# Patient Record
Sex: Male | Born: 1945 | Race: White | Hispanic: No | Marital: Married | Smoking: Former smoker
Health system: Southern US, Community
[De-identification: ages and names within clinical notes are randomized; demographics above are authoritative.]

## PROBLEM LIST (undated history)

## (undated) DIAGNOSIS — C801 Malignant (primary) neoplasm, unspecified: Secondary | ICD-10-CM

## (undated) DIAGNOSIS — I4892 Unspecified atrial flutter: Secondary | ICD-10-CM

## (undated) DIAGNOSIS — M3131 Wegener's granulomatosis with renal involvement: Secondary | ICD-10-CM

## (undated) DIAGNOSIS — E271 Primary adrenocortical insufficiency: Secondary | ICD-10-CM

## (undated) HISTORY — PX: COLON SURGERY: SHX602

## (undated) NOTE — Telephone Encounter (Signed)
 Formatting of this note might be different from the original. Timothy Chase called and would like a call back.  Is this an urgent matter? No  Information from caller: Timothy Chase states he received a message to schedule a left heart cath - angiogram. He states he already had this and not sure why he's getting a request to schedule another one. PSS unable to see a referral or order for this. Please reach out to him to discuss at your earliest convenience.   Are you ok with a MyChart response from your providers office? No  PATIENT SERVICE Education Administrator signed by Eleanor Skelton at 11/25/2024  9:02 AM PST

## (undated) NOTE — Procedures (Signed)
 Associated Order(s): CARDIAC DEVICE CHECK - REMOTE Procedure(s): CARDIAC DEVICE CHECK - REMOTE Formatting of this note might be different from the original. REMOTE MONITOR TRANSMISSION/FOLLOW-UP  Reason for transmission: Routine monthly device check   The patient's Implantable cardiac device received a remote transmission   Please see results of this interrogation in the Cardiology Procedure Report Tab in [facility] Chart  Interpretation: normal device function  - Battery status: Good  - Rate trends and histograms: Unremarkable   - Symptom-triggered episodes: None  - Auto-triggered episodes: 1 pause is undersensing  Disposition/Plan: Follow-up as scheduled, continue monthly checks  Morene Rush, MD, [facility] Department of Cardiology [address] [place], [place] [address] Provider Priority Line [phone] Patient Line: [phone] Fax [phone]  Electronically signed by Andrea Harden, RN at 11/24/2024  3:09 PM PST

## (undated) NOTE — Telephone Encounter (Signed)
 Formatting of this note might be different from the original. No care due was identified. [employer] Embedded Care Due Messages. Reference number:  [id number]. 11/04/2024 6:00:39 AM PST Electronically signed by Edi, Transcription at 11/04/2024  6:00 AM PST

## (undated) NOTE — Telephone Encounter (Signed)
 Formatting of this note is different from the original. PREDNISONE  REFILL  Is Patient up to date on labs: Yes/No pt had CBC and BMP 10.08.25 PH but no CMP since 06.04.24 Is Patient up to date on office visit: Yes Verified Dose: Yes Verified Refill Quantity: Yes Is Patient due for refill: Yes  Is Patient on a tapering dose: No Is Patient asking for Prednisone  taper: No  NOTE: If patient is on long term prednisone  please verify with provider before denying medication.   Medication refilled per protocol? No-Sending to provider to review  Last DEXA:    Protocol: CMP Annually unless otherwise noted) Lab Results  Component Value Date   NA 142 12/06/2023   K 4.8 12/06/2023   CL 103 12/06/2023   CO2 26 12/06/2023   BUN 23.5 (H) 12/06/2023   CREATININE 1.49 (H) 12/06/2023   AST 25 05/22/2023   ALT 18 05/22/2023   BILITTOT 0.27 05/22/2023   ALP 53 05/22/2023   ALB 3.64 (L) 12/06/2023     Last Office visit: 09/11/2024  ASSESSMENT: 1.  GPA. 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 Lower suspicion for RP. To date he has had no site that is sufficient for a biopsy diagnosis and because of a high clinical suspicion for GPA, he's been on prednisone , other conventional immunomodulatory agents.  With symptoms refractory to conventional therapy, he underwent rituximab induction and maintenance therapy. He had done really well on rituximab maintenance, which was eventually discontinued. After discontinuing he developed multiple symptoms including neurosensory hearing loss, dizziness, malaise, shortness of breath. He was also found to have proteinuria and slightly worsened renal function. He also had some ground glass opacities on chest CT, though it was pulmonary's feeling that this was less suspicious for GPA. He's seen nephrology, no signs of  active GPA. Suspects CKD with minimal proteinuria. CT also showed improvement with resolution of prior ground glass opacities. Again, no clear sign of active pulmonary involvement.   Interval assessment: Continues to do well, clinically stable. Recommend continuing on prednisone  as he's had difficulty tapering in the past and likely has some degree of adrenal insufficiency.  He is aware of symptoms to watch for, he will let us  know.  No indication to start any additional immunomodulatory therapy at this time.  May consider reducing prednisone  in the future but this has been difficult for him in the past.   2.  Prevention of steroid-induced osteoporosis, high risk of osteoporosis. Repeat DEXA was normal. He's been on  Fosamax on and off for approximately 10 years. Will repeat DEXA sometime after 01/2025.   PLAN: -Continue prednisone  5 mg daily for now -Follow-up nephrology  RETURN VISIT: 6 months   Electronically signed by Damien Bane, LPN at 88/79/7974 12:06 PM PST

## (undated) NOTE — Telephone Encounter (Signed)
 Formatting of this note is different from the original.   Reason for call   hypertension  Call Type    Incoming call from nurse triage queue.  Caller   Patient Advised they were on a recorded line, name and DOB of patient verified. ROI not needed caller is patient. Patient gave the phone to family's friend,  Assessment   Initial Assessment Questions Responses  Onset today  Location BP  Character (Aggravating/Alleviating factors, radiating, pattern etc.) Patient's friend reports BP 200/110. States patient is asymptomatic. Patient only reports headache 5/10.  The caller states Patient is walking fine. At the end of call, the friend reported patient is currently in [place] .   Treatments Tried None reported  Denies Chest pain, vision changes, difficulty breathing.   *All other symptoms negative as per protocol and/or guideline referenced in this encounter.  Plan advised by RN    Per protocol advised that patient be seen in office now.  Emergency Department precautions reviewed with the caller.   Patient/Caller response to advised plan   The Caller verbalizes understanding of the advice given and agrees to the plan. Advised patient to walk-in to [facility] for further evaluation.  Reason for Disposition  Systolic BP >= 200 OR Diastolic >= 120 and having NO cardiac or neurologic symptoms  Additional Information  Negative: Sounds like a life-threatening emergency to the triager  Negative: Systolic BP >= 160 OR Diastolic >= 100, and any cardiac (e.g., breathing difficulty, chest pain) or neurologic symptoms (e.g., new-onset blurred or double vision)  Negative: Patient sounds very sick or weak to the triager  Protocols used: Blood Pressure - High-A-OH  Electronically signed by Buel Glasgow, RN at 12/12/2024  2:09 PM PST

## (undated) NOTE — Progress Notes (Signed)
 Formatting of this note is different from the original. In-Person Office Visit [facility] [place], [place], 12 CARDIOLOGY  Patient: Timothy Chase   Timothy Number: [id number]   Date of Birth: 01-Dec-1946   Date of Visit: 10/17/2024    PCP: Marvis Shack, MD Primary Cardiologist: DOROTHA Opal, MD  Subjective   Timothy Chase is a pleasant 64 y.o. year old male with medical hx including:   Wegener's granulomatosis, atrial flutter s/p ablation (2014?), CAD, syncope, HTN,   Arrives today for follow up of recent angiogram due to fatigue, substernal chest discomfort with diaphoresis.   States he continues to have some weakness and fatigue which is unchanged, he has not had any eposdes of chest pain and overall feels okay.  States his blood pressure fluctuates frequently, states it is all over from 120's-180's. Not consistent in general, however he does check it daily.  No report today of chest pain, chest pressure, shortness of breath, palpitations, orthopnea, dyspnea on exertion or syncope. No lower extremity or abdominal edema, unexpected weight gain.    Relevant Tests and Imaging   ECG:  10/17/24 Sinus bradycardia Otherwise normal ECG  ECHO:  10/06/2024: Summary:                      The calculated ejection fraction is 60% using the 3D full volume method. Impaired left ventricular relaxation with normal left atrial filling pressures (normal for age). Normal right ventricular size and function. Right ventricular systolic pressure of 31 mmHg consistent with normal pulmonary artery pressures. Mild-moderate mitral regurgitation is present.   CARDIAC CATHETERIZATION: 09/24/2024: Cardiac Catheterization Lab Report   Procedures:  Selective coronary angiography  Left heart catheterization   Brief History: Timothy Chase is an 34 year old man with a history of syncope,  chest pain, coronary artery disease referred for cardiac catheterization  for unstable angina.   Time Out was performed.    Anesthesia:  I provided Moderate Sedation. During this time I was  face-to-face with the patient and supervising the independent trained  observer (RN); who monitored the patient?s level of consciousness and  physiological status. Total Face-To-Face Intra-Service Time was 30  minutes.   Description: After informed consent was obtained, the patient was brought  to the cath lab in the fasting state.  After dual circulation was  confirmed in the right upper extremity the right arm was prepped and  draped in the usual sterile fashion and 1% lidocaine  without epinephrine   was used as local anesthesia over the right radial artery.  Then, using  direct ultrasound guidance and micropuncture technique, a 6 French slender  sheath was advanced into the right radial artery without difficulty.   Heparin was given, intra-arterial nitroglycerin 200 g  and intra-arterial  verapamil 2.5 mg were also given.   The right coronary artery was engaged using a 6 French diagnostic JR4  catheter.  The left coronary artery was engaged using a 6 French  diagnostic JL 3.5 catheter.  Left heart catheterization was performed  using a 5 French angled pigtail catheter.   At the conclusion all wires and catheters were withdrawn and a TR band was  placed for hemostasis.   The patient tolerated the procedure well and there are no immediate  complications.   Findings:   1. Hemodynamics: The LVEDP is 16 mmHg. No gradient across the aortic  valve.   2. Coronary anatomy:  Left Main: Large smoothly contoured vessel with no significant disease  dividing into LAD and circumflex.  Left anterior descending: Moderate size vessel with smooth contours.   There is a large first diagonal branch which has no significant disease  and traverses the lateral wall.  The continuing LAD has minor  irregularities and takes some tortuosity in the mid vessel before  continuing to the apex with no focal disease.   Circumflex:  Large vessel with minor calcification in the AV groove but  with no focal stenosis.  There are 2 obtuse marginal branches proximally  which are normal and the vessel terminates into a final obtuse marginal  branch which is normal.   Right Coronary Artery: Large dominant vessel with minor irregularities in  the mid vessel.  The distal vessel is smoothly contoured and divides into  a large posterolateral branch and a large posterior descending artery.  Of  note, there is a small RV marginal branch arising from the mid vessel and  it is mildly tortuous but smoothly contoured.  At its terminus there is a  small region of myocardial blush suggestive of a vascularized structure in  that territory.   Technical factors:  Sedation: fentanyl and versed  Contrast: Isovue 28 mL  Radiation: 86 mGy   Complications: None   Conclusions:   1. Mildly elevated left ventricular end-diastolic pressure  2. Mild nonobstructive epicardial coronary artery disease   LABS:  Lab Results  Component Value Date   GLUCOSE 110 12/06/2023   BUN 23.5 (H) 12/06/2023   CREATININE 1.49 (H) 12/06/2023   CALCIUM 8.8 12/06/2023   NA 142 12/06/2023   K 4.8 12/06/2023   CL 103 12/06/2023   CO2 26 12/06/2023   GFRAA >60 07/27/2020   GFRNONAA 59 07/27/2020   Hemoglobin A1C  Date Value Ref Range Status  05/13/2021 5.8 (H) 4.0 - 5.6 % Final    Comment:    Increased risk for diabetes (prediabetes): 5.7-6.4%. Diabetes: >/= 6.5%. Interpretive information based on Diagnosis and Classification of Diabetes Mellitus, American Diabetes Association.  For patients with hemoglobinopathies, hemolytic anemia, other hemolytic diseases, and polycythemia, measurements of HgBA1c may not be a reliable indicator of glucose load.   INR  Date Value Ref Range Status  02/05/2013 2.4 (H) 0.8 - 1.2 Final   Lab Results  Component Value Date   CHOL 178.00 11/22/2023   LDLDIRECT 100 (H) 11/22/2023   HDL 63.90 11/22/2023   TRIG  105 11/22/2023   ASCVD:  The ASCVD Risk score (Arnett DK, et al., 2019) failed to calculate for the following reasons:   The 2019 ASCVD risk score is only valid for ages 25 to 30  Objective  There were no vitals taken for this visit.  Physical Exam Constitutional:      General: Not in acute distress.    Appearance: Normal appearance.  Eyes:     General: No scleral icterus. Neck:     Vascular: Jugular veins flat Cardiovascular/Extremities:   - Regular rate  - Regular rhythm. - No murmur - No rub or gallop, PMI non displaced. - No lower extremity edema.  - No clubbing or cyanosis of extremities. Pulmonary:     Effort: Pulmonary effort is normal.     Breath sounds: No wheezing, rhonchi or rales.  Skin:    General: Skin is warm and dry.  Neurological:     General: No focal deficit present.     Mental Status: Alert and oriented to person, place, and time.  Psychiatric:        Mood and Affect: Mood normal.  Behavior: Behavior normal.   Current Outpatient Medications   Current Outpatient Medications  Medication Sig Dispense Refill   aspirin  (ASPIRIN  EC) 81 MG EC tablet Take 1 tablet by mouth daily. 100 tablet 2   predniSONE  (DELTASONE ) 5 MG tablet Take 1 tablet by mouth daily. 90 tablet 0   No current facility-administered medications for this visit.    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 Cardiac Event Monitor 08/26/21: Benign event monitor. - Generally sinus rhythm at normal rates. Average heart rate is 63 bpm. - No concerning pauses or high-grade heart block.  No significant dysrhythmias.  Sinus bradycardia during sleeping hours.  B. Transthoracic echocardiogram 05/28/21: 1. Left ventricular ejection fraction, by estimation, is 55 to 60%. The left ventricle has normal function. The left ventricle has no regional wall motion abnormalities. There is  mild left ventricular hypertrophy. Left ventricular diastolic parameters   are consistent with Grade I diastolic dysfunction (impaired relaxation).   2. Right ventricular systolic function is normal. The right ventricular size is normal. Tricuspid regurgitation signal is inadequate for assessing PA pressure.   3. The mitral valve is normal in structure. Trivial mitral valve regurgitation. No evidence of mitral stenosis.   4. The aortic valve is tricuspid. Aortic valve regurgitation is not visualized. Mild to moderate aortic valve sclerosis/calcification is present, without any evidence of aortic stenosis.   5. The inferior vena cava is normal in size with greater than 50% respiratory variability, suggesting right atrial pressure of 3 mmHg     Atrial flutter  (HCC) [I48.92] 12/16/2012    Priority: High    Overview Note:    A. [facility] 12/13/12: atrial flutter with rapid ventricular response, spontaneously converted B. echocardiogram 12/13/2012:  normal left ventricular size and systolic function, normal  right ventricular size and systolic function, no significant valvular disease C. 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    Coronary artery disease involving native coronary artery of native heart without angina pectoris [I25.10] 10/17/2024    Overview Note:    09/24/2024: 1. Mildly elevated left ventricular end-diastolic pressure  2.  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 - due to chronic steroid use - continue prednisone    Syncope [R55] 05/28/2021    Overview Note:    Last Assessment & Plan:  Formatting of this note is different from the original. Given association of these episodes happening in the bathroom after having either been in the shower or having a bowel movement, vasovagal is also favored (maybe some contribution of covid as well?).  Orthostatics negative on admission.  Other considered etiology would be symptomatic bradycardia although prior office notes show his  heart rate is at baseline in the 50s and he has not been symptomatic previously -Checking echo to evaluate for other structural abnormalities.  EKG unremarkable for conduction defects.  - echo also reassuring  - Cardiology consulted for opinion as well. CTA chest obtained, negative for PE. - at this time major etiologies have been ruled out.  - this may still be vasovagal given they keep happening when uses the bathroom (not everytime but the episodes all have using bathroom in common); other consideration would be possibly needing some workup more with adrenals since he has been on chronic prednisone . 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  Unable to obtain tissue diagnosis inside of sinus and lung biopsy.left lower lobe nodularity and groundglass appearance identified on CT scan 2012 .  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 06/12/2000 rectal [facility] at [facility] - T3 N0 at 5-7 cm. 06/22/2000 ?colonoscopy at [facility] - tubular adenoma, only pathology seen. 2001 chemoradiotherapy. 01/08/2001 low anterior resection with diverting ileostomy at [facility] 03/20/2001 ileoostomy takedown at [place]. 01/24/2002 rectal biopsy at [facility] - granulation tissue. 2005 colonoscopy at [facility] per later notes. 05/23/2012 colonoscopy Dr. Douglass at [facility] - 8 mm rectal polyp, hyperplastic polyp favored over sessile serrated adenoma.; diverticulosis, anastomosis not mentioned. 07/29/2014 colonoscopy Dr. Jama at [facility] - rectal nodule, hyperplastic polyp and granulation tissue; no other description. 04/28/2020 colonoscopy Dr. Jama - 4 mm tubular adenoma, sigmoid with focal mild active colitis, hemorrhoids, divertculosis. - repeat 5 years optional if in good health   History of colonic polyps [Z86.0100] 05/23/2012    Overview Note:    See under history of rectal cancer   Sterile pyuria [R82.81] 01/30/2012   Osteoporosis/osteopenia increased risk [Z91.89] 12/21/2011   Social History   Tobacco Use  Smoking Status Former   Current packs/day: 0.00   Average packs/day: 1 pack/day for 10.0 years (10.0 ttl pk-yrs)   Types: Cigarettes   Start date: 04/07/1964   Quit date: 04/07/1974   Years since quitting: 50.5  Smokeless Tobacco Never   Social History    Substance and Sexual Activity  Alcohol Use No   Family History  Problem Relation Name Age of Onset   Cancer Mother Nehemias Sauceda    Vision loss Mother Environmental Consultant    Cancer Father Gaspare Netzel    Colon cancer Neg Hx     Colon polyps Neg Hx     Bladder cancer Neg Hx     Bleeding disorders Neg Hx     Urolithiasis Neg Hx     Prostate cancer Neg Hx     Kidney cancer Neg Hx     Skin cancer Neg Hx     Glaucoma Neg  Hx     Macular degen Neg Hx     Assessment & Plan   Problem List Items Addressed This Visit      Cardiac and Vasculature   Coronary artery disease involving native coronary artery of native heart without angina pectoris - Primary   Overview  09/24/2024: 1. Mildly elevated left ventricular end-diastolic pressure  2.  Mild nonobstructive epicardial coronary artery disease     -Skanda is recovering well post angiogram. -He does continue to have fatigue, however has not had any chest pain episodes. -He is taking his medications daily as prescribed. -He is due to go out of town over the next few months and will not be bringing his ILR device. -Follow up with Dr. Milo in ~4 months, sooner if needed with new or worsening concerns.    I spent 26 minutes today on this patient's visit. This includes obtaining and documenting the history and physical, discussion with the patient regarding the treatment plan, review of records for relevant visits, tests, and procedures along with summary of these results in the appropriate section of this note. This time is excluding any time spent on separately billed tests/procedures.  This note was prepared in part using Arts development officer. Please excuse any errors related to the Dragon transcription system.  Vernell Slade, [facility] Cardiology [address] [place], [address]  [place], [place] [address]  Provider Priority Line [phone] Patient Line: [phone] Fax [phone] Electronically signed by Vernell Slade, ARNP at 10/17/2024  1:26 PM PDT

## (undated) NOTE — Telephone Encounter (Signed)
 Formatting of this note might be different from the original. Called patient and informed that scheduling is only available through 01/13/2025 at this time. Recall notification is set for 12/15/2024. Patient will be out of town from 10/29/2024 to end of February, and this recall timing should be appropriate. Patient agrees to keep this as set. Electronically signed by Lauraine Hancock, RN at 10/28/2024 10:51 AM PST

## (undated) NOTE — Progress Notes (Signed)
 Formatting of this note is different from the original. Images from the original note were not included. Patient: Timothy Chase MRN: [id number]  DOB: 16-Feb-1946   SUBJECTIVE:  56 y.o. male here for follow up for GPA, prevention of steroid induced osteoporosis.  Last seen 04/08/2024 where we continued prednisone  5 mg daily, recommended follow up with nephrology.  Overall reports is doing very well.  Denies any nasal congestion, discharge or bloody noses.  Denies any ear or nose pain or swelling.  Denies any sinus pressure, ocular pain or vision changes.  Denies any neurologic symptoms, joint pain or swelling, unusual rashes.  Does report some low energy.  Cardiologist is looking into potential cardiac causes.  Denies any shortness of breath.  Maintains prednisone  5 mg daily and has not forgotten doses.  Tolerating this well.  No recent infections.  No fractures.  Will be having a bone density scan hopefully next year. Pain severity rated 4/10 today   ROS: no visual changes, rashes, shortness of breath, fevers, chills, infections,new or worsening joint swelling, slurred speech, weakness, numbness or current HA's, hemoptysis.  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   Sterile pyuria   History of colonic polyps   Atrial flutter  (HCC)   History of rectal cancer   Bradycardia   SOB (shortness of breath)   Granulomatosis with polyangiitis  (HCC)   Calcific tendinitis of both shoulders   Primary osteoarthritis of both hips   Wegener's granulomatosis   Chronic tension-type headache, intractable   Other headache syndrome   Primary osteoarthritis of both first carpometacarpal joints   Primary osteoarthritis of both hands   Primary osteoarthritis of wrists, bilateral   Stokes-Adams syncope   Adrenal insufficiency   History of stroke   Syncope   Uncontrolled steroid-induced diabetes mellitus   Stage 3a chronic kidney disease  (HCC)   Other proteinuria   Chronic  fatigue   Chronic kidney disease-mineral and bone disorder   Interstitial pulmonary disease, unspecified (HCC)   Anemia of chronic renal failure, stage 3b  (HCC)   Primary osteoarthritis of left knee   Outpatient Medications Marked as Taking for the 09/11/24 encounter (Office Visit) with Cresencio Argyle, MD  Medication Sig Dispense Refill   aspirin  (ASPIRIN  EC) 81 MG EC tablet Take 1 tablet by mouth daily. 100 tablet 2   predniSONE  (DELTASONE ) 5 MG tablet Take 1 tablet by mouth daily. 90 tablet 0   Allergies: No Known Allergies   Social History   Socioeconomic History   Marital status: Married    Spouse name: Not on file   Number of children: Not on file   Years of education: Not on file   Highest education level: Not on file  Occupational History    Employer: RETIRED  Tobacco Use   Smoking status: Former    Current packs/day: 0.00    Average packs/day: 1 pack/day for 10.0 years (10.0 ttl pk-yrs)    Types: Cigarettes    Start date: 04/07/1964    Quit date: 04/07/1974    Years since quitting: 50.4   Smokeless tobacco: Never  Vaping Use   Vaping status: Never Used  Substance and Sexual Activity   Alcohol use: No   Drug use: No   Sexual activity: Not Currently    Partners: Female    Birth control/protection: None    Comment: married  Other Topics Concern   Not on file  Social History Narrative   Not on file  Social Drivers of Corporate Investment Banker Strain: Not on file  Food Insecurity: No Food Insecurity (06/28/2023)   Hunger Vital Sign    Worried About Running Out of Food in the Last Year: Never true    Ran Out of Food in the Last Year: Never true  Transportation Needs: No Transportation Needs (06/28/2023)   PRAPARE - Administrator, Civil Service (Medical): No    Lack of Transportation (Non-Medical): No  Physical Activity: Not on file  Stress: Not on file  Social Connections: Not on file  Intimate Partner Violence: Not At Risk (05/28/2021)   Received  from [facility]   Humiliation, Afraid, Rape, and Kick questionnaire    Within the last year, have you been afraid of your partner or ex-partner?: No    Within the last year, have you been humiliated or emotionally abused in other ways by your partner or ex-partner?: No    Within the last year, have you been kicked, hit, slapped, or otherwise physically hurt by your partner or ex-partner?: No    Within the last year, have you been raped or forced to have any kind of sexual activity by your partner or ex-partner?: No  Housing Stability: Low Risk  (06/28/2023)   Housing Stability Vital Sign    Unable to Pay for Housing in the Last Year: No    Number of Places Lived in the Last Year: 1    Unstable Housing in the Last Year: No   Family History: No rheumatic disease reported.   PHYSICAL EXAM:  BP 124/64 (Site: Left, Position: Sitting, Cuff Size: Standard)   Temp 97.4 F (36.3 C) (Temporal)   Wt 160 lb (72.6 kg)   BMI 24.33 kg/m  General appearance: Normally developed, appropriately groomed Psychiatric: Oriented to time, person.  Normal affect. Eyes: PERRL, Conjunctivae and eyelids are normal Nose: Nasal bridge deformity present.  No tenderness. Ears: No swelling or tenderness. Respiratory: Clear to auscultation bilaterally.  No wheezes, rhonchi or crackles.  Normal respiratory effort Cardiovascular: Regular rate and rhythm, no murmurs, rubs or gallops.   Skin: No  ulcerations or nodules. Musculoskeletal: Bony prominence noted in the PIP joints   LAB DATA: Lab Results  Component Value Date   WBC 6.5 12/31/2023   HGB 12.9 (L) 12/31/2023   HCT 40.7 12/31/2023   MCV 93.8 12/31/2023   PLT 268 12/31/2023   Lab Results  Component Value Date   NA 142 12/06/2023   K 4.8 12/06/2023   CL 103 12/06/2023   CO2 26 12/06/2023   BUN 23.5 (H) 12/06/2023   CREATININE 1.49 (H) 12/06/2023   GLUCOSE 110 12/06/2023   AST 25 05/22/2023   ALT 18 05/22/2023   BILITTOT 0.27 05/22/2023   ALP 53  05/22/2023   ALB 3.64 (L) 12/06/2023   EGFRNR 47 12/06/2023   CALCIUM 8.8 12/06/2023   PROTEIN 6.4 (L) 05/22/2023   GLOB 2.3 05/22/2023   Lab Results  Component Value Date   SEDRATE 11 12/31/2023   Lab Results  Component Value Date   CRP 1.3 (H) 12/31/2023   Component     Latest Ref Rng & Units 03/02/2023 03/02/2023 03/02/2023         1:54 PM  1:54 PM  1:54 PM  Color, Urine     Colorless-Yellow-Dark Yellow-Amber NA  Yellow   Clarity, Urine     Clear NA  Clear   Glucose, UA     Negative NA  Negative  Ketones, Urine     Negative NA  Negative   Urine Specific Gravity     1.002 - 1.030 NA  1.019   Red Blood Cells, Urine     Negative NA 0-4 Negative   pH, UA     5.0 - 8.0 NA  7.5   Protein, Urine     Negative NA  Negative 15  Nitrite, Urine     Negative NA  Negative   Leukocytes Esterase, UA     Negative NA  Negative   White Blood Cells, Urine     0 - 5 /hpf  0-5   Bacteria, Urin     None Seen-Few NA  None Seen   Squamous Epithelial Cells, Urine     0 - 5 /hpf  0-2   Hyaline Casts, Urine     0 - 2 /lpf  0-2   Prot/Creat Ratio, Ur     25 - 148 mg/g creat   90  Creatinine,U     20 - 320 mg/dL   833  Protein/Creat Ratio     0.025 - 0.148 mg/mg creat   0.090   Component     Latest Ref Rng & Units 04/10/2022  MYELOPEROXIDASE ANTIBODY     <1.0 AI <1.0  Proteinase 3 by EIA     <1.0 AI <1.0   02/16/2022  Component     Latest Ref Rng 11/04/2014  Creatine Kinase     39 - 308 U/L 58  Aldolase      4.4   STUDIES: 01/31/2023 EXAM: DXA CLINICAL HISTORY: Age: 21 years Gender: Male Risk Factors: Chronic steroid use COMPARISON: July 15, 2020 TECHNIQUE: The study was performed using the [employer] bone densitometer. RESULTS: Femoral Neck: BMD measured at the Left femoral neck is 0.850 g/cm sq. T score: -0.6 Z score: 0.9 Prior Femoral Neck: BMD measured at the Left femoral neck is 0.825 g/cm sq. T score: -0.8 Total Hip: BMD measured at the Left total  hip is 1.070 g/cm sq. T score: 0.2 Z score: 1.2 Prior Total Hip: BMD measured at the Left total hip is 1.113 g/cm sq. T score: 0.5 Lumbar spine previously nondiagnostic. Wrist: BMD measured at the Left distal 1/3 radius is 0.902 g/cm sq. T score: 1.6 Z score: 3.5 Prior Wrist: BMD measured at the Left distal 1/3 radius is 0.895 g/cm sq. T score: 1.5 Z score: 3.2 Comments: None. Patient has been treated with Fosamax in the last two years. IMPRESSION: Diagnosis: Normal bone mineral density Fracture risk: Not Increased Interval Change: Bone mineral density has not significantly changed overall compared to 2021.  05/04/2022 EXAM: CT CHEST WO CONTRAST  IMPRESSION: 1.  Overall improved appearance of bilateral scattered groundglass opacities compared to 03/20/2022, and with waxing and waning appearance dating back to 2012 likely represent infectious or inflammatory etiology. 2.  No suspicious pulmonary nodules. 3.  Cholelithiasis. Lung nodule recommendations: *N0 No lung nodules  03/20/2022 EXAM: HIGH RESOLUTION CT OF THE CHEST IMPRESSION: Multifocal nodular foci along the lower lobes bilaterally with semisolid nodules bilaterally with peripheral fluffy margins and groundglass adjacent to each, few groundglass opacities elsewhere. Suspect each of these is postinflammatory and new since June 2022. Recommend short-term reassessment follow-up in 4-6 weeks after appropriate therapy to assess for changes over time. If these persist, further imaging such as PET/CT and/or histologic sampling might be needed No significant adenopathy No consolidations or effusions Stable cardiomegaly without congestive features Cholelithiasis  03/06/2022 Transthoracic Echocardiography Report (TTE)  Conclusions   Normal left ventricular size and function.   Ejection fraction is estimated at 60-65%.   Normal right ventricle size and function.   Normal diastolic filling pattern.   No significant  valvular abnormalities.   Right ventricular systolic pressure estimate normal.   02/16/2022 EXAM: XR CHEST 2 VIEWS (XR CHEST PA AND LAT) IMPRESSION: No acute cardiopulmonary process.  05/16/2018 BRAIN MRI WITH AND WITHOUT IV CONTRAST  IMPRESSION: Interval worsening right maxillary sinus disease, otherwise stable appearance of the left occipital DVA, right parietal calvarial hemangioma and 2 small superficial scalp nodules.  05/16/2018 MRI OF THE CERVICAL SPINE WITH AND WITHOUT IV CONTRAST  IMPRESSION: 1. Significant left neuroforaminal narrowing at C3-4 and C4-5, likely impinging upon the exiting C4 and C5 nerve roots. 2. Mild central canal narrowing at C3-4. 3. Chronic appearing degenerative disc disease at the C3-C7 levels as above. 4. No enhancing paraspinal soft tissue mass seen postcontrast.  03/19/2018 DEXA BONE DENSITY  IMPRESSION: 1. Normal study.   03/19/2018 DEXA BONE DENSITY WRIST IMPRESSION: Normal study   03/12/2017 LUMBAR SPINE 3 VIEWS IMPRESSION: Multilevel degenerative disc disease and facet disease, most pronounced at L4-5 and L5-S1.  03/12/2017 BILATERAL HIP, INCLUDING AP PELVIS, 3 VIEW COMPLETE IMPRESSION: Stable x-ray appearance of both hips and pelvis.   03/12/2017 THREE-VIEW CERVICAL SPINE  IMPRESSION: Multilevel degenerative disc disease present, the findings most pronounced at C5-6, C6-7 and C3-4.  03/12/2017 TWO VIEW CHEST X-RAY IMPRESSION: No acute process seen on two view chest X-ray.  02/06/2017 MRI BRAIN W WO CONTRAST  IMPRESSION: 1.  Interval resolution of right maxillary sinus mucosal thickening.  2.  Otherwise stable enhanced MRI of the brain. 3.  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 4.  Stable right parietal calvarial hemangioma. 5.  2 small stable enhancing superficial scalp nodules  07/06/2016 MRI KNEE LEFT  WO CONTRAST  IMPRESSION: 1.  Abnormal exam, with diffuse advanced osteoarthritis of the knee. This most significantly affects the  medial and patellofemoral compartments. The medial meniscus is nearly entirely absent, and this may either be secondary to prior surgical meniscectomy or severe meniscal degradation. There is additionally near complete chondromalacia of the medial compartment. Less severe chondromalacia of the patellofemoral compartment and lateral compartment is noted. There are numerous loose body seen throughout the joint. The lateral meniscus appears degenerated and atrophic with marginal degenerative fraying of the free edge and a complex appearing tear of the anterior horn adjacent to the root. A small para meniscal cyst is evident in this location.  06/14/2016 XR KNEE 3V LEFT (AP/LAT/SUNRISE)  IMPRESSION: 1.  Small, possibly loose, intra-articular ossicle just caudal to the patella.  2.  Tricompartmental DJD, moderate medially. 3.  Moderate suprapatellar effusion.  01/26/2016 MRI BRAIN W WO CONTRAST  IMPRESSION: 1. Stable enhanced MRI of the brain. 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 There is no evidence of hemorrhage. No new enhancing lesions within the brain are identified. There is no MR evidence of acute intracranial ischemic event. 2. 2. Acute over chronic right maxillary sinus inflammation as seen on prior examination. 3. 3. Stable patchy T2 hyperintensity within the left mastoid sinus, consistent with chronic inflammation.  4. 4. Stable benign calvarial lesion on the right, most consistent with a hemangioma.  09/20/2015 DEXA IMPRESSION: Diagnosis: Normal bone mineral density Fracture risk: Not Increased  06/18/2015 MRI BRAIN W WO CONTRAST IMPRESSION: 1.  No acute intracranial abnormality. 2.  Chronic isolated right maxillary sinus mucosal thickening with new small dependent  fluid. There is nearby metallic susceptibility artifact suggesting prior trauma or surgery. 3.  Small patchy fluid in the inferior left mastoid  air cells, similar to prior. 4.  Unchanged nonaggressive appearing right parietal calvarial lesion, likely a hemangioma. 5.  Left parieto-occipital developmental venous anomaly.  06/15/2015 XR SHOULDER 4 VIEW LT (INT ROT/GRASHEY/AXILLARY/SCAP-Y) IMPRESSION: Calcific tendinitis suggested.  06/15/2015 XR SHOULDER 4 VIEW RT (INT ROT/GRASHEY/AXILLARY/SCAP-Y) IMPRESSION: Calcific tendinitis suggested.  06/15/15 XR HIPS BILATERAL AP LATERAL W AP PELVIS IMPRESSION: 1.  Mild bilateral hip osteoarthritis.  11/11/2014 MRI BRAIN W WO CONTRAST IMPRESSION:  1. Stable examination. No new specific orbital or brain parenchymal abnormality evident. 2. Stable enhancing right parietal calvarial lesion, may represent a hemangioma. 3. 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 4. Moderately severe right maxillary sinus disease.  01/09/2013 XR CHEST 2 VIEWS (XR CHEST PA AND LAT)  IMPRESSION: :  1. No acute cardiopulmonary process.  ASSESSMENT: 1.  GPA. 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 Lower suspicion for RP. To date he has had no site that is sufficient for a biopsy diagnosis and because of a high clinical suspicion for GPA, he's been on prednisone , other conventional immunomodulatory agents.  With symptoms refractory to conventional therapy, he underwent rituximab induction and maintenance therapy. He had done really well on rituximab maintenance, which was eventually discontinued. After discontinuing he developed multiple symptoms including neurosensory hearing loss, dizziness, malaise, shortness of breath. He was also found to have proteinuria and slightly worsened renal function. He also had some ground glass opacities on chest CT, though it was pulmonary's feeling that this was less suspicious for GPA. He's seen  nephrology, no signs of active GPA. Suspects CKD with minimal proteinuria. CT also showed improvement with resolution of prior ground glass opacities. Again, no clear sign of active pulmonary involvement.   Interval assessment: Continues to do well, clinically stable. Recommend continuing on prednisone  as he's had difficulty tapering in the past and likely has some degree of adrenal insufficiency.  He is aware of symptoms to watch for, he will let us  know.  No indication to start any additional immunomodulatory therapy at this time.  May consider reducing prednisone  in the future but this has been difficult for him in the past.   2.  Prevention of steroid-induced osteoporosis, high risk of osteoporosis. Repeat DEXA was normal. He's been on  Fosamax on and off for approximately 10 years. Will repeat DEXA sometime after 01/2025.   PLAN: -Continue prednisone  5 mg daily for now -Follow-up nephrology  RETURN VISIT: 6 months   Portions of this note may have been dictated using voice recognition software and may from time to time have grammatical errors which are unintentional. I followed appropriate precautions, and wore proper PPE during this patient encounter including a mask   I, Lum Devonshire, am scribing for, and in the presence of Dr. Cresencio Argyle. I, Dr. Cresencio Argyle, personally performed the services described in this documentation, as scribed by Lum Devonshire in my presence, and it is both accurate and complete.    Electronically signed by Cresencio Argyle, MD at 09/11/2024  3:23 PM PDT

## (undated) NOTE — Telephone Encounter (Signed)
 Formatting of this note might be different from the original. Luz does not need a second angiogram at this time.  Zeus scheduled for f/u with  Dr Milo, 02/11/25 at 1120am at [facility]. Electronically signed by Delon Endo, RN at 11/25/2024  4:50 PM PST

## (undated) NOTE — Telephone Encounter (Signed)
 Formatting of this note might be different from the original. Patient called and would like a call back.  Is this an urgent matter? No  Information from caller:   Patient called to schedule his 4 month follow up which would be for the end of February. Now appts available to schedule. He is going out of town and is asking for someone in Cardiology to send him a scheduling ticket when appointments become available.  Are you ok with a MyChart response from your providers office? Yes  PATIENT SERVICE Education Administrator signed by Powell Sicks at 10/24/2024  1:04 PM PST

## (undated) NOTE — Progress Notes (Signed)
 Formatting of this note is different from the original. [facility]  ASSESSMENT/PLAN:  Timothy Chase is a 51 y.o. year old, male, with Wegener's granulomatosis, atrial flutter s/p ablation (2014?),  Coronary artery disease, syncope.   Recurring chest discomfort concerning for angina: Coronary artery disease by CT: Incidental finding of scattered atherosclerosis on CT of chest. Worsening frequency of substernal discomfort with diaphoresis. Fatigue: normal rhythm by ILR. No significant bradycardia. Cr 1.49. Monitor. - Get coronary angiogram. Discussed risks and benefits. - For profound fatigue, get echo. -We reviewed the pathophysiology of coronary artery disease, risk factor alleviation, and treatment.  I counseled the patient on signs and symptoms of angina and heart failure which would prompt immediate evaluation.   - aspirin  81 mg daily. - LDL is 100mg /Dl. If CAD on angiogram, start statin.  Atrial flutter status post ablation: Sinus bradycardia: Syncope: Suspect vasovagal syncope(frequent syncope or presyncope after using the bathroom, showering).  No new syncopal events.   -At Dr. Nicola recommendation, continue with ILR.  No anticoagulation for now.  -Not on beta-blocker due to bradycardia.  Hypertension: Currently normal blood pressures.   -We reviewed the pathophysiology of hypertension, contributing factors, its effect on cardiac disease, and lifestyle modifiers and medication to alleviate this risk. -   Return to clinic: 3 months  The ASCVD Risk score (Arnett DK, et al., 2019) failed to calculate for the following reasons:   The 2019 ASCVD risk score is only valid for ages 77 to 41  HISTORY OF PRESENT ILLNESS:  Timothy Chase reports intermittent chest discomfort which has been occurring with increased frequency. Two events in the last three months. Severe discomfort / squeezing, now lasting 5 minutes.   He complains also of severe fatigue and  lightheadedness on his feet.  He is without exertional chest pain or pressure.  He complains of mild fatigue but is able to walk for many miles.  No orthopnea, PND, edema.  No fever, chills or night sweats. No history of black tarry stool or GI bleed. All other systems are negative.  There are no Patient Instructions on file for this visit.  Current Outpatient Medications on File Prior to Visit  Medication Sig Dispense Refill   aspirin  (ASPIRIN  EC) 81 MG EC tablet Take 1 tablet by mouth daily. 100 tablet 2   predniSONE  (DELTASONE ) 5 MG tablet Take 1 tablet by mouth daily. 90 tablet 0   No current facility-administered medications on file prior to visit.   Parts of this note were prepared using dictation software. Please excuse inadvertent transcription errors.  PHYSICAL EXAM: GENERAL: pleasant male, alert, oriented x 3, in NAD HEENT: Sclera anicteric, oral mucous membranes moist CARDIOVASCULAR / Extremities - No bruits, JVP without JVD,  - S1, S2. Rate within normal limits.  - No murmur, rub or gallop.  - No edema.  - No clubbing or cyanosis of extremities. - Radial pulses bilaterally palpable.  RESPIRATORY:   Breathing non-labored.  Breath sounds clear equal to auscultation.  GI: Abdomen soft and nontender, liver and spleen nonpalpable MUSCULOSKELETAL: Spine without kyphosis or scoliosis. SKIN: warm and dry, no obvious rashes PSYCH: Normal affect. Alert and oriented.  Wt Readings from Last 3 Encounters:  09/19/24 161 lb (73 kg)  09/11/24 160 lb (72.6 kg)  09/10/24 160 lb 12.8 oz (72.9 kg)   Body mass index is 24.48 kg/m. Blood pressure 138/70, pulse 59, height 5' 8 (1.727 m), weight 161 lb (73 kg).   LABS: Lab Results  Component  Value Date   NA 142 12/06/2023   K 4.8 12/06/2023   CL 103 12/06/2023   CO2 26 12/06/2023   BUN 23.5 (H) 12/06/2023   CREATININE 1.49 (H) 12/06/2023   GLUCOSE 110 12/06/2023   Lab Results  Component Value Date   WBC 6.5 12/31/2023    HGB 12.9 (L) 12/31/2023   HCT 40.7 12/31/2023   MCV 93.8 12/31/2023   PLT 268 12/31/2023   Lab Results  Component Value Date   ALT 18 05/22/2023   AST 25 05/22/2023   Lab Results  Component Value Date   CHOL 178.00 11/22/2023   LDLDIRECT 100 (H) 11/22/2023   HDL 63.90 11/22/2023   TRIG 105 11/22/2023   RECORDS REVIEWED: I personally reviewed the old records and recent laboratory data. Important data summarized in the problem list and Past medical history section including available ECG tracings, echocardiogram reports, stress imaging, and angiogram reports.  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   Unspecified nonsenile cataract    Wegener's granulomatosis    Past Surgical History:  Procedure Laterality Date   ABLATION OF DYSRHYTHMIC FOCUS     COLON SURGERY  2000   see problem list   COLONOSCOPY     see problem list   CYSTOSCOPY  02/21/2012   KNEE SURGERY     PR INSERTION SUBQ CARDIAC RHYTHM MONITOR W/PRGRMG N/A 10/26/2021   INSERTION,SUBCUTANEOUS CARDIAC RHYTHM MONITOR,INCLUDING PROGRAMMING performed by Morene Rush, MD at [facility]   PR XCAPSL CTRC RMVL INSJ IO LENS PROSTH W/O ECP Right 03/08/2016   EXTRACAPSULAR CATARACT REMOVAL BY MECHANICAL TECHNIQUE WITH INSERTION OF INTRAOCULAR LENS PROSTHESIS performed by Artist Slain, MD at [facility]   PR XCAPSL CTRC RMVL INSJ IO LENS PROSTH W/O ECP Left 04/05/2016   EXTRACAPSULAR CATARACT REMOVAL BY MECHANICAL TECHNIQUE WITH INSERTION OF INTRAOCULAR LENS PROSTHESIS performed by Artist Slain, MD at [facility]   sinus biopsy      TONSILLECTOMY AND ADENOIDECTOMY     UPPER GASTROINTESTINAL ENDOSCOPY  04/28/2020   Dr. 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 Cardiac Event Monitor 08/26/21: Benign event monitor. - Generally sinus rhythm at normal rates. Average heart rate is 63 bpm. - No concerning pauses or high-grade heart block.  No significant dysrhythmias.  Sinus bradycardia during sleeping hours.  B. Transthoracic echocardiogram 05/28/21: 1. Left ventricular ejection fraction, by estimation, is 55 to 60%. The left ventricle has normal function. The left ventricle has no regional wall motion abnormalities. There is mild left ventricular hypertrophy. Left ventricular diastolic parameters   are consistent with Grade I diastolic dysfunction (impaired relaxation).   2. Right ventricular systolic function is normal. The right ventricular size is normal. Tricuspid regurgitation signal is inadequate for assessing PA pressure.   3. The mitral valve is normal in structure. Trivial  mitral valve regurgitation. No evidence of mitral stenosis.   4. The aortic valve is tricuspid. Aortic valve regurgitation is not visualized. Mild to moderate aortic valve sclerosis/calcification is present, without any evidence of aortic stenosis.   5. The inferior vena cava is normal in size with greater than 50% respiratory variability, suggesting right atrial pressure of 3 mmHg     Atrial flutter  (HCC) [I48.92] 12/16/2012    Priority: High    Overview Note:    A. [facility] 12/13/12: atrial flutter with rapid ventricular response, spontaneously converted B. echocardiogram 12/13/2012:  normal left ventricular size and systolic function, normal  right ventricular size and systolic function, no significant valvular disease C. electrophysiology study and catheter ablation 02/14/13:  Cavotricuspid Isthmus ablation with bidirectional block achieved.    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 - due to chronic steroid use - continue prednisone    Syncope [R55] 05/28/2021    Overview Note:    Last Assessment & Plan:  Formatting of this note is different from the original. Given association of these episodes happening in the bathroom after having either been in the shower or having a bowel movement, vasovagal is also favored (maybe some contribution of covid as well?).  Orthostatics negative on admission.  Other considered etiology would be symptomatic bradycardia although prior office notes show his heart rate is at baseline in the 50s and he has not been symptomatic previously -Checking echo to evaluate for other structural abnormalities.  EKG unremarkable for conduction defects.  - echo also reassuring  - Cardiology consulted for opinion as well. CTA chest obtained, negative for PE. - at this time major etiologies have been ruled out.  - this may still be vasovagal given they keep happening when uses the bathroom (not everytime but the episodes all have using bathroom in common); other consideration would be possibly needing some workup more with adrenals since he has been on chronic prednisone . 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  Unable to obtain tissue diagnosis inside of sinus and lung biopsy.left lower lobe nodularity and groundglass appearance identified on CT scan 2012 .  On maintenance Rituxan   Calcific tendinitis of both shoulders [M75.31, M75.32] 07/12/2015   Primary osteoarthritis of both hips [M16.0] 07/12/2015   Bradycardia [R00.1] 11/20/2014   SOB (shortness of breath) [R06.02] 11/20/2014   History of rectal cancer [Z85.048] 07/03/2014    Overview  Note:    05/09/2000 FLS at [facility] - 1 cm flat rectal adenocarcinoma. 06/12/2000 rectal ultrasound at [facility] - T3 N0 at 5-7 cm. 06/22/2000 ?colonoscopy at [facility] - tubular adenoma, only pathology seen. 2001 chemoradiotherapy. 01/08/2001 low anterior resection with diverting ileostomy at [facility]. 03/20/2001 ileoostomy takedown at [place]. 01/24/2002 rectal biopsy at [facility] - granulation tissue. 2005 colonoscopy at [facility] per later notes. 05/23/2012 colonoscopy Dr. Douglass at [facility] - 8 mm rectal polyp, hyperplastic polyp favored over sessile serrated adenoma.; diverticulosis, anastomosis not mentioned. 07/29/2014 colonoscopy Dr. Jama at [facility] - rectal nodule, hyperplastic polyp and granulation tissue; no other description. 04/28/2020 colonoscopy Dr. Jama - 4 mm tubular adenoma, sigmoid with focal mild active colitis, hemorrhoids, divertculosis. - repeat 5 years optional if in good health   History of colonic polyps [Z86.0100]  05/23/2012    Overview Note:    See under history of rectal cancer   Sterile pyuria [R82.81] 01/30/2012   Osteoporosis/osteopenia increased risk [Z91.89] 12/21/2011   No Known Allergies  Outpatient Medications Marked as Taking for the 09/19/24 encounter (Office Visit) with Dorn Opal, MD  Medication Sig Dispense Refill   aspirin  (ASPIRIN  EC) 81 MG EC tablet Take 1 tablet by mouth daily. 100 tablet 2   predniSONE  (DELTASONE ) 5 MG tablet Take 1 tablet by mouth daily. 90 tablet 0   Social History   Socioeconomic History   Marital status: Married    Spouse name: Not on file   Number of children: Not on file   Years of education: Not on file   Highest education level: Not on file  Occupational History    Employer: RETIRED  Tobacco Use   Smoking status: Former    Current packs/day: 0.00    Average packs/day: 1 pack/day for 10.0 years (10.0 ttl pk-yrs)    Types: Cigarettes    Start date: 04/07/1964    Quit date: 04/07/1974    Years since quitting: 50.4   Smokeless tobacco: Never  Vaping Use   Vaping status: Never Used  Substance and Sexual Activity   Alcohol use: No   Drug use: No   Sexual activity: Not Currently    Partners: Female    Birth control/protection: None    Comment: married  Other Topics Concern   Not on file  Social History Narrative   Not on file   Social Drivers of Health   Financial Resource Strain: Not on file  Food Insecurity: No Food Insecurity (06/28/2023)   Hunger Vital Sign    Worried About Running Out of Food in the Last Year: Never true    Ran Out of Food in the Last Year: Never true  Transportation Needs: No Transportation Needs (06/28/2023)   PRAPARE - Administrator, Civil Service (Medical): No    Lack of Transportation (Non-Medical): No  Physical Activity: Not on file  Stress: Not on file  Social Connections: Not on file  Intimate Partner Violence: Not At Risk (05/28/2021)   Received from [facility]   Humiliation, Afraid,  Rape, and Kick questionnaire    Within the last year, have you been afraid of your partner or ex-partner?: No    Within the last year, have you been humiliated or emotionally abused in other ways by your partner or ex-partner?: No    Within the last year, have you been kicked, hit, slapped, or otherwise physically hurt by your partner or ex-partner?: No    Within the last year, have you been raped or forced to have  any kind of sexual activity by your partner or ex-partner?: No  Housing Stability: Low Risk  (06/28/2023)   Housing Stability Vital Sign    Unable to Pay for Housing in the Last Year: No    Number of Places Lived in the Last Year: 1    Unstable Housing in the Last Year: No   family history includes Cancer in his father and mother; Vision loss in his mother. Thank you once again for allowing me to participate in the care of [facility]. Sincerely,  Dorn Opal, MD [facility] Department of Cardiology [address] [place], [place] [address] phone [phone] Fax [phone]  The After Visit Summary for this visit was printed and reviewed with the patient. All related questions were answered. The patient expressed understanding of the document.  CC: Lynwood Moulding, MD  Electronically signed by Dorn Opal, MD at 09/19/2024 10:09 AM PDT

## (undated) NOTE — Telephone Encounter (Signed)
 Formatting of this note is different from the original. Medication(s) Being Requested:   Requested Prescriptions   Pending Prescriptions Disp Refills   predniSONE  (DELTASONE ) 5 MG tablet 90 tablet 0    Sig: Take 1 tablet by mouth daily.   Chart Review:   Last Office Visit/Telemedicine with Rheumatologist: 09/11/2024 Next appointment with Rheumatologist: Visit date not found  Associated Vitals/Lab Work:   Lab Results  Component Value Date   WBC 6.5 12/31/2023   HGB 12.9 (L) 12/31/2023   HCT 40.7 12/31/2023   MCV 93.8 12/31/2023   PLT 268 12/31/2023   Lab Results  Component Value Date   NA 142 12/06/2023   K 4.8 12/06/2023   CL 103 12/06/2023   CO2 26 12/06/2023   BUN 23.5 (H) 12/06/2023   CREATININE 1.49 (H) 12/06/2023   GLUCOSE 110 12/06/2023   AST 25 05/22/2023   ALT 18 05/22/2023   BILITTOT 0.27 05/22/2023   ALP 53 05/22/2023   ALB 3.64 (L) 12/06/2023   EGFRNR 47 12/06/2023   CALCIUM 8.8 12/06/2023   PROTEIN 6.4 (L) 05/22/2023   GLOB 2.3 05/22/2023   BP Readings from Last 3 Encounters:  10/17/24 (!) 152/78  09/26/24 (!) 140/76  09/19/24 138/70     Action:   Pending to Department MA: This is an off protocol medication request. Prescription pended for further review.   Electronically signed by Rosina Bedelia Candy, RN at 11/04/2024  8:47 AM PST

---

## 2021-05-28 ENCOUNTER — Inpatient Hospital Stay (HOSPITAL_BASED_OUTPATIENT_CLINIC_OR_DEPARTMENT_OTHER)
Admission: EM | Admit: 2021-05-28 | Discharge: 2021-05-30 | DRG: 178 | Disposition: A | Discharge status: Home / Self Care | Payer: Medicare Other | Attending: Internal Medicine | Admitting: Internal Medicine

## 2021-05-28 ENCOUNTER — Emergency Department (HOSPITAL_BASED_OUTPATIENT_CLINIC_OR_DEPARTMENT_OTHER): Payer: Medicare Other

## 2021-05-28 ENCOUNTER — Encounter (HOSPITAL_BASED_OUTPATIENT_CLINIC_OR_DEPARTMENT_OTHER): Payer: Self-pay

## 2021-05-28 ENCOUNTER — Other Ambulatory Visit: Payer: Self-pay

## 2021-05-28 DIAGNOSIS — J449 Chronic obstructive pulmonary disease, unspecified: Secondary | ICD-10-CM | POA: Diagnosis not present

## 2021-05-28 DIAGNOSIS — E2740 Unspecified adrenocortical insufficiency: Secondary | ICD-10-CM | POA: Diagnosis present

## 2021-05-28 DIAGNOSIS — E273 Drug-induced adrenocortical insufficiency: Secondary | ICD-10-CM | POA: Diagnosis not present

## 2021-05-28 DIAGNOSIS — N179 Acute kidney failure, unspecified: Secondary | ICD-10-CM | POA: Diagnosis present

## 2021-05-28 DIAGNOSIS — Z8679 Personal history of other diseases of the circulatory system: Secondary | ICD-10-CM

## 2021-05-28 DIAGNOSIS — Z85038 Personal history of other malignant neoplasm of large intestine: Secondary | ICD-10-CM | POA: Diagnosis not present

## 2021-05-28 DIAGNOSIS — Z7952 Long term (current) use of systemic steroids: Secondary | ICD-10-CM | POA: Diagnosis not present

## 2021-05-28 DIAGNOSIS — Z87891 Personal history of nicotine dependence: Secondary | ICD-10-CM

## 2021-05-28 DIAGNOSIS — M3131 Wegener's granulomatosis with renal involvement: Secondary | ICD-10-CM | POA: Diagnosis present

## 2021-05-28 DIAGNOSIS — R55 Syncope and collapse: Secondary | ICD-10-CM | POA: Diagnosis present

## 2021-05-28 DIAGNOSIS — U071 COVID-19: Secondary | ICD-10-CM | POA: Diagnosis not present

## 2021-05-28 DIAGNOSIS — R4020 Unspecified coma: Secondary | ICD-10-CM

## 2021-05-28 DIAGNOSIS — Z7983 Long term (current) use of bisphosphonates: Secondary | ICD-10-CM | POA: Diagnosis not present

## 2021-05-28 DIAGNOSIS — R001 Bradycardia, unspecified: Secondary | ICD-10-CM | POA: Diagnosis not present

## 2021-05-28 DIAGNOSIS — Z79899 Other long term (current) drug therapy: Secondary | ICD-10-CM

## 2021-05-28 DIAGNOSIS — R030 Elevated blood-pressure reading, without diagnosis of hypertension: Secondary | ICD-10-CM | POA: Diagnosis not present

## 2021-05-28 DIAGNOSIS — T380X5A Adverse effect of glucocorticoids and synthetic analogues, initial encounter: Secondary | ICD-10-CM | POA: Diagnosis not present

## 2021-05-28 HISTORY — DX: Primary adrenocortical insufficiency: E27.1

## 2021-05-28 HISTORY — DX: Unspecified atrial flutter: I48.92

## 2021-05-28 HISTORY — DX: Malignant (primary) neoplasm, unspecified: C80.1

## 2021-05-28 HISTORY — DX: Wegener's granulomatosis with renal involvement: M31.31

## 2021-05-28 LAB — CBC WITH DIFFERENTIAL/PLATELET
Abs Immature Granulocytes: 0.03 10*3/uL (ref 0.00–0.07)
Basophils Absolute: 0 10*3/uL (ref 0.0–0.1)
Basophils Relative: 0 %
Eosinophils Absolute: 0 10*3/uL (ref 0.0–0.5)
Eosinophils Relative: 0 %
HCT: 42.5 % (ref 39.0–52.0)
Hemoglobin: 13.8 g/dL (ref 13.0–17.0)
Immature Granulocytes: 1 %
Lymphocytes Relative: 8 %
Lymphs Abs: 0.5 10*3/uL — ABNORMAL LOW (ref 0.7–4.0)
MCH: 30.4 pg (ref 26.0–34.0)
MCHC: 32.5 g/dL (ref 30.0–36.0)
MCV: 93.6 fL (ref 80.0–100.0)
Monocytes Absolute: 0.7 10*3/uL (ref 0.1–1.0)
Monocytes Relative: 13 %
Neutro Abs: 4.6 10*3/uL (ref 1.7–7.7)
Neutrophils Relative %: 78 %
Platelets: 219 10*3/uL (ref 150–400)
RBC: 4.54 MIL/uL (ref 4.22–5.81)
RDW: 14 % (ref 11.5–15.5)
WBC: 5.9 10*3/uL (ref 4.0–10.5)
nRBC: 0 % (ref 0.0–0.2)

## 2021-05-28 LAB — BASIC METABOLIC PANEL
Anion gap: 9 (ref 5–15)
BUN: 26 mg/dL — ABNORMAL HIGH (ref 8–23)
CO2: 28 mmol/L (ref 22–32)
Calcium: 8.3 mg/dL — ABNORMAL LOW (ref 8.9–10.3)
Chloride: 101 mmol/L (ref 98–111)
Creatinine, Ser: 1.56 mg/dL — ABNORMAL HIGH (ref 0.61–1.24)
GFR, Estimated: 46 mL/min — ABNORMAL LOW (ref >60)
Glucose, Bld: 115 mg/dL — ABNORMAL HIGH (ref 70–99)
Potassium: 4.6 mmol/L (ref 3.5–5.1)
Sodium: 138 mmol/L (ref 135–145)

## 2021-05-28 LAB — CBG MONITORING, ED: Glucose-Capillary: 85 mg/dL (ref 70–99)

## 2021-05-28 LAB — D-DIMER, QUANTITATIVE: D-Dimer, Quant: 0.58 ug/mL-FEU — ABNORMAL HIGH (ref 0.00–0.50)

## 2021-05-28 LAB — RESP PANEL BY RT-PCR (FLU A&B, COVID) ARPGX2
Influenza A by PCR: NEGATIVE
Influenza B by PCR: NEGATIVE
SARS Coronavirus 2 by RT PCR: POSITIVE — AB

## 2021-05-28 LAB — SEDIMENTATION RATE: Sed Rate: 18 mm/hr — ABNORMAL HIGH (ref 0–16)

## 2021-05-28 LAB — TROPONIN I (HIGH SENSITIVITY)
Troponin I (High Sensitivity): 6 ng/L (ref <18)
Troponin I (High Sensitivity): 6 ng/L (ref <18)
Troponin I (High Sensitivity): 5 ng/L (ref <18)

## 2021-05-28 LAB — BRAIN NATRIURETIC PEPTIDE: B Natriuretic Peptide: 74.9 pg/mL (ref 0.0–100.0)

## 2021-05-28 MED ORDER — SODIUM CHLORIDE 0.9 % IV BOLUS
1000.0000 mL | Freq: Once | INTRAVENOUS | Status: AC
  Administered 2021-05-28: 1000 mL via INTRAVENOUS

## 2021-05-28 MED ORDER — NIRMATRELVIR/RITONAVIR (PAXLOVID)TABLET
2.0000 | ORAL_TABLET | Freq: Two times a day (BID) | ORAL | Status: DC
  Administered 2021-05-28 – 2021-05-30 (×4): 2 via ORAL
  Filled 2021-05-28: qty 30

## 2021-05-28 NOTE — ED Notes (Signed)
Pt lying flat for orthostatic vs

## 2021-05-28 NOTE — ED Notes (Signed)
ED Provider at bedside. 

## 2021-05-28 NOTE — Plan of Care (Signed)
  Problem: Education: Goal: Knowledge of General Education information will improve Description: Including pain rating scale, medication(s)/side effects and non-pharmacologic comfort measures Outcome: Progressing   Problem: Health Behavior/Discharge Planning: Goal: Ability to manage health-related needs will improve Outcome: Progressing   Problem: Clinical Measurements: Goal: Ability to maintain clinical measurements within normal limits will improve Outcome: Progressing Goal: Will remain free from infection Outcome: Progressing Goal: Diagnostic test results will improve Outcome: Progressing Goal: Respiratory complications will improve Outcome: Progressing Goal: Cardiovascular complication will be avoided Outcome: Progressing   Problem: Coping: Goal: Level of anxiety will decrease Outcome: Progressing   Problem: Pain Managment: Goal: General experience of comfort will improve Outcome: Progressing   Problem: Safety: Goal: Ability to remain free from injury will improve Outcome: Progressing   Problem: Education: Goal: Knowledge of risk factors and measures for prevention of condition will improve Outcome: Progressing   Problem: Coping: Goal: Psychosocial and spiritual needs will be supported Outcome: Progressing   Problem: Respiratory: Goal: Will maintain a patent airway Outcome: Progressing Goal: Complications related to the disease process, condition or treatment will be avoided or minimized Outcome: Progressing

## 2021-05-28 NOTE — Progress Notes (Signed)
Patient: Timothy Chase, Timothy Chase  DOB: 09-18-2046 MRN: [id number]   I discussed the following case with Janeece Fitting (PA at [facility] ED), who requests transfer of the above patient from [facility] ED for further evaluation and management of multiple episodes of syncope over the last.  Of note, he is reportedly from the state of [place], and is currently in [place] visiting family.   This is a 75 y.o. M with h/o Addison's disease, copd, who presented to [place] on 05/28/2021 complaining of syncope.  He reportedly has experienced at least 2 episodes of syncope over the last day, in which the patient notes duration of unconscious time of approximately 30 minutes per each of these episodes.  Unclear if associated with prodrome, but denies any associated chest pain.  He also reportedly has been experiencing a few days of generalized myalgias, prompting him to take a home COVID test earlier today, which was reportedly positive, although no confirmatory picture of this home test result provided.  He notes a chronic cough, without recent worsening relative to this baseline.  Otherwise, denies any acute respiratory symptoms.   Vital signs at [facility] ED were notable for the following: Afebrile, heart rates in the mid 50s to mid 96E; systolic blood pressures in the 140s to 150smmHg, oxygen saturations in the high 90s on room air.  Orthostatic vital signs reportedly not consistent with orthostatic hypotension.   Labs were notable for BUN 26, creatinine 1.56 with unclear baseline renal function, glucose 115.  CBC notable for wbc 5900, Hgb 13.8.  Troponin I x2 nonelevated at 6 on both occasions.  D-dimer nonelevated after age-related adjustment (0.58).   EKG showed sinus bradycardia with heart rate 54 without evidence of heart block, and no evidence of T wave or ST changes.  CXR showed no acute process.  Aside from a 1 L normal saline bolus, no additional IV fluids or medications administered while at [facility] ED.    Without confirmatory picture of positive home COVID-19 test, I asked EDP to order formal COVID-19 screen.     Subsequently, I accepted this patient for transfer for observation to med/tele bed at [facility] for further work-up and management of multiple syncopal episodes in the last day.     Babs Bertin, DO Hospitalist

## 2021-05-28 NOTE — ED Triage Notes (Signed)
States had a BM and passed out 2 days ago. Today had another syncopal episode. Tested positive for COVID with home test today. C/o headache.

## 2021-05-28 NOTE — ED Provider Notes (Signed)
[facility] Provider Note   CSN: [id number] Arrival date & time: 05/28/21  1249     History Chief Complaint  Patient presents with   Loss of Consciousness    Covid +    Timothy Chase is a 75 y.o. male.  75 y.o male with a PMH of Addisons disease, Osteochondritis presents to the ED with a chief complaint of multiple syncopal episodes since 2 days ago.  Patient reports he was done having a bowel movement, when he suddenly stood up from his bathroom try to ambulate and had a syncopal episode.  He reports he was likely down for about 30 minutes although he does not recall the episode.  In addition, he is continue to feel groggy after this fall.  He thought that after this episode, he thought this was cold sweats might have been food poisoning.  In addition, reports this morning he took a shower, walked over to the sink and stood there began to have cold sweats and had a second syncopal episode, again he was unsure how long he was down for.  He does report similar episodes in the past few years, with the same presentation, happening when he sitting to standing however reports that this time the difference is there is achiness all throughout his body.  He did take a COVID-19 test today which was positive.  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  Also reports a headache, feeling constant aching to the front of his head.  He is currently on daily prednisone 5 mg.  He denies any cardiac history, no family history of blood clots, no family history of heart disease.  Patient is currently here visiting on vacation from [place] state.  Does report prior cardiology, pulmonology, primary care follow-up in his home state.    The history is provided by the patient.  Loss of Consciousness Episode history:  Multiple Most recent episode:  Yesterday Duration:  30  minutes Timing:  Constant Progression:  Unchanged Chronicity:  New Context: bowel movement   Witnessed: no   Relieved by:  Nothing Worsened by:  Nothing Ineffective treatments:  None tried Associated symptoms: diaphoresis, headaches and malaise/fatigue   Associated symptoms: no chest pain, no fever, no palpitations, no shortness of breath and no vomiting   Risk factors: vascular disease   Risk factors: no congenital heart disease and no coronary artery disease       Past Medical History:  Diagnosis Date   Adrenal insufficiency (Addison's disease) ([place])    Cancer ([place])    Colon   Wegner's osteochondritis     Patient Active Problem List   Diagnosis Date Noted   Syncope 05/28/2021     Past Surgical History:  Procedure Laterality Date   COLON SURGERY         History reviewed. No pertinent family history.  Social History   Tobacco Use   Smoking status: Former    Pack years: 0.00    Types: Cigarettes  Substance Use Topics   Alcohol use: Never   Drug use: Never    Home Medications Prior to Admission medications   Medication Sig Start Date End Date Taking? Authorizing Provider  alendronate (FOSAMAX) 70 MG tablet Take 1 tablet by mouth every 7 (seven) days. 12/26/12  Yes [provider]  azaTHIOprine (IMURAN) 50 MG tablet Take by mouth. 05/05/13  Yes [provider]  budesonide-formoterol (SYMBICORT) 80-4.5 MCG/ACT inhaler Inhale into the lungs. 05/06/13  Yes [provider]  predniSONE (DELTASONE) 5 MG tablet TAKE ONE TABLET BY MOUTH ONE TIME DAILY. TAKE WITH 1 MG TABLET FOR A TOTAL DOSE OF 6 MG 04/11/16  Yes [provider]  alendronate (FOSAMAX) 70 MG tablet Take 70 mg by mouth once a week. 04/18/21   [provider]  aspirin 81 MG chewable tablet Chew by mouth.    [provider]  riTUXimab (RITUXAN) 500 MG/50ML injection Inject into the vein.    [provider]    Allergies    Patient has no known  allergies.  Review of Systems   Review of Systems  Constitutional:  Positive for diaphoresis and malaise/fatigue. Negative for chills and fever.  Respiratory:  Positive for chest tightness. Negative for shortness of breath.   Cardiovascular:  Positive for syncope. Negative for chest pain and palpitations.  Gastrointestinal:  Negative for anal bleeding and vomiting.  Musculoskeletal:  Negative for back pain.  Skin:  Negative for pallor.  Neurological:  Positive for syncope and headaches.  All other systems reviewed and are negative.  Physical Exam Updated Vital Signs BP (!) 176/78 (BP Location: Right Arm)   Pulse (!) 54   Temp 98.4 F (36.9 C) (Oral)   Resp 16   Ht 5\' 8"  (1.727 m)   Wt 72.6 kg   SpO2 100%   BMI 24.33 kg/m   Physical Exam Vitals and nursing note reviewed.  Constitutional:      Appearance: Normal appearance.  HENT:     Head: Normocephalic and atraumatic.     Mouth/Throat:     Mouth: Mucous membranes are moist.  Cardiovascular:     Rate and Rhythm: Bradycardia present.  Pulmonary:     Effort: Pulmonary effort is normal.     Breath sounds: No wheezing or rales.     Comments: Lungs are clear to auscultation with no wheezing, rhonchi, rales. Abdominal:     General: Abdomen is flat.  Skin:    General: Skin is warm and dry.  Neurological:     Mental Status: He is alert and oriented to person, place, and time.     Comments: Alert, oriented, thought content appropriate. Speech fluent without evidence of aphasia. Able to follow 2 step commands without difficulty.  Cranial Nerves:  II:  Peripheral visual fields grossly normal, pupils, round, reactive to light III,IV, VI: ptosis not present, extra-ocular motions intact bilaterally  V,VII: smile symmetric, facial light touch sensation equal VIII: hearing grossly normal bilaterally  IX,X: midline uvula rise  XI: bilateral shoulder shrug equal and strong XII: midline tongue extension  Motor:  5/5 in upper and  lower extremities bilaterally including strong and equal grip strength and dorsiflexion/plantar flexion Sensory: light touch normal in all extremities.  Cerebellar: normal finger-to-nose with bilateral upper extremities, pronator drift negative      ED Results / Procedures / Treatments   Labs (all labs ordered are listed, but only abnormal results are displayed) Labs Reviewed  BASIC METABOLIC PANEL - Abnormal; Notable for the following components:      Result Value   Glucose, Bld 115 (*)    BUN 26 (*)    Creatinine, Ser 1.56 (*)    Calcium 8.3 (*)    GFR, Estimated 46 (*)    All other components within normal limits  CBC WITH DIFFERENTIAL/PLATELET - Abnormal; Notable for the following components:   Lymphs Abs 0.5 (*)    All  other components within normal limits  SEDIMENTATION RATE - Abnormal; Notable for the following components:   Sed Rate 18 (*)    All other components within normal limits  D-DIMER, QUANTITATIVE - Abnormal; Notable for the following components:   D-Dimer, Quant 0.58 (*)    All other components within normal limits  RESP PANEL BY RT-PCR (FLU A&B, COVID) [id number]  BRAIN NATRIURETIC PEPTIDE  CBG MONITORING, ED  TROPONIN I (HIGH SENSITIVITY)  TROPONIN I (HIGH SENSITIVITY)  TROPONIN I (HIGH SENSITIVITY)    EKG EKG Interpretation  Date/Time:  Saturday May 28 2021 13:12:40 EDT Ventricular Rate:  54 PR Interval:  142 QRS Duration: 107 QT Interval:  421 QTC Calculation: 399 R Axis:   -14 Text Interpretation: Sinus rhythm Abnormal R-wave progression, early transition Minimal ST depression, lateral leads no STEMI Confirmed by Lavenia Atlas [phone]) on 05/28/2021 2:04:58 PM  Radiology DG Chest Port 1 View  Result Date: 05/28/2021 CLINICAL DATA:  Syncope EXAM: PORTABLE CHEST 1 VIEW COMPARISON:  None. FINDINGS: The cardiomediastinal silhouette is the upper limits of normal in contour. No pleural effusion. No pneumothorax. No acute pleuroparenchymal abnormality.  Visualized abdomen is unremarkable. No acute osseous abnormality noted. IMPRESSION: No acute cardiopulmonary abnormality. Electronically Signed   By: Valentino Saxon MD   On: 05/28/2021 13:52    Procedures Procedures   Medications Ordered in ED Medications  sodium chloride 0.9 % bolus 1,000 mL (0 mLs Intravenous Stopped 05/28/21 1836)    ED Course  I have reviewed the triage vital signs and the nursing notes.  Pertinent labs & imaging results that were available during my care of the patient were reviewed by me and considered in my medical decision making (see chart for details).  Clinical Course as of 05/28/21 1849  Sat May 28, 2021  1405 Creatinine(!): 1.56 No prior records on file [JS]  1702 Sed Rate(!): 18 [JS]  1751 Sed Rate(!): 18 [JS]    Clinical Course User Index [JS] Janeece Fitting, PA-C   MDM Rules/Calculators/A&P     Patient here with a past medical history of Wegener's presents to the ED after multiple syncopal episodes for the past 2 days.  Reports similar history while in his home state of [place] state, he is currently here on vacation.  Has had a total of 3 episodes in the past 2 days.  Feeling all body aches, unsure whether this is due to COVID-19 diagnoses.  Orthostatic vital signs were obtained, not much difference from lying to sitting to standing.  Still provided with 1 L fluids.  He is COVID-positive from today's visit, has a new AKI with a creatinine of 1.5, denies any prior history of kidney disease.  Interpretation of the rest of his labs reveal a CBC without any leukocytosis, hemoglobin is within normal limits.  CBG on arrival was normal.  Sed rate is 18, BNP is negative, no prior history of heart failure.  During reassessment, patient began to complain of chest tightness, a troponin was added to his work-up, first 1 was negative.  Will obtain delta.  Patient has persistently been bradycardic while in the ED, heart rate has been below the 60 range, with  his last one around 53.  No hypoxia, he is normotensive.  Xray of the chest: No acute cardiopulmonary abnormality.  6:40 PM Spoke to Dr. Glo Herring hospitalist at [facility] who will admit patient for further management.  Although patient did have a positive COVID test while at home, will obtain 1 for our records.  Patient remains hemodynamically stable.  Portions of this note were generated with Lobbyist. Dictation errors may occur despite best attempts at proofreading.  Final Clinical Impression(s) / ED Diagnoses Final diagnoses:  Syncope and collapse    Rx / DC Orders ED Discharge Orders     None        Janeece Fitting, PA-C 05/28/21 Foley, [place], DO 05/30/21 [id number]

## 2021-05-29 ENCOUNTER — Other Ambulatory Visit (HOSPITAL_COMMUNITY): Payer: Medicare Other

## 2021-05-29 DIAGNOSIS — J449 Chronic obstructive pulmonary disease, unspecified: Secondary | ICD-10-CM | POA: Diagnosis present

## 2021-05-29 DIAGNOSIS — Z79899 Other long term (current) drug therapy: Secondary | ICD-10-CM | POA: Diagnosis not present

## 2021-05-29 DIAGNOSIS — R55 Syncope and collapse: Secondary | ICD-10-CM | POA: Diagnosis present

## 2021-05-29 DIAGNOSIS — Z7983 Long term (current) use of bisphosphonates: Secondary | ICD-10-CM | POA: Diagnosis not present

## 2021-05-29 DIAGNOSIS — M3131 Wegener's granulomatosis with renal involvement: Secondary | ICD-10-CM | POA: Diagnosis present

## 2021-05-29 DIAGNOSIS — T380X5A Adverse effect of glucocorticoids and synthetic analogues, initial encounter: Secondary | ICD-10-CM | POA: Diagnosis present

## 2021-05-29 DIAGNOSIS — R030 Elevated blood-pressure reading, without diagnosis of hypertension: Secondary | ICD-10-CM | POA: Diagnosis present

## 2021-05-29 DIAGNOSIS — E273 Drug-induced adrenocortical insufficiency: Secondary | ICD-10-CM | POA: Diagnosis present

## 2021-05-29 DIAGNOSIS — R001 Bradycardia, unspecified: Secondary | ICD-10-CM | POA: Diagnosis present

## 2021-05-29 DIAGNOSIS — E271 Primary adrenocortical insufficiency: Secondary | ICD-10-CM | POA: Diagnosis not present

## 2021-05-29 DIAGNOSIS — Z7952 Long term (current) use of systemic steroids: Secondary | ICD-10-CM | POA: Diagnosis not present

## 2021-05-29 DIAGNOSIS — Z8679 Personal history of other diseases of the circulatory system: Secondary | ICD-10-CM

## 2021-05-29 DIAGNOSIS — E2740 Unspecified adrenocortical insufficiency: Secondary | ICD-10-CM

## 2021-05-29 DIAGNOSIS — U071 COVID-19: Secondary | ICD-10-CM | POA: Diagnosis present

## 2021-05-29 DIAGNOSIS — Z85038 Personal history of other malignant neoplasm of large intestine: Secondary | ICD-10-CM | POA: Diagnosis not present

## 2021-05-29 DIAGNOSIS — Z87891 Personal history of nicotine dependence: Secondary | ICD-10-CM | POA: Diagnosis not present

## 2021-05-29 DIAGNOSIS — N179 Acute kidney failure, unspecified: Secondary | ICD-10-CM | POA: Diagnosis present

## 2021-05-29 LAB — URINALYSIS, ROUTINE W REFLEX MICROSCOPIC
Bilirubin Urine: NEGATIVE
Glucose, UA: NEGATIVE mg/dL
Hgb urine dipstick: NEGATIVE
Ketones, ur: NEGATIVE mg/dL
Leukocytes,Ua: NEGATIVE
Nitrite: NEGATIVE
Protein, ur: NEGATIVE mg/dL
Specific Gravity, Urine: 1.015 (ref 1.005–1.030)
pH: 5 (ref 5.0–8.0)

## 2021-05-29 LAB — COMPREHENSIVE METABOLIC PANEL
ALT: 15 U/L (ref 0–44)
AST: 22 U/L (ref 15–41)
Albumin: 3 g/dL — ABNORMAL LOW (ref 3.5–5.0)
Alkaline Phosphatase: 42 U/L (ref 38–126)
Anion gap: 8 (ref 5–15)
BUN: 24 mg/dL — ABNORMAL HIGH (ref 8–23)
CO2: 26 mmol/L (ref 22–32)
Calcium: 8.2 mg/dL — ABNORMAL LOW (ref 8.9–10.3)
Chloride: 105 mmol/L (ref 98–111)
Creatinine, Ser: 1.15 mg/dL (ref 0.61–1.24)
GFR, Estimated: >60 mL/min (ref >60)
Glucose, Bld: 80 mg/dL (ref 70–99)
Potassium: 4 mmol/L (ref 3.5–5.1)
Sodium: 139 mmol/L (ref 135–145)
Total Bilirubin: 0.4 mg/dL (ref 0.3–1.2)
Total Protein: 5.6 g/dL — ABNORMAL LOW (ref 6.5–8.1)

## 2021-05-29 LAB — C-REACTIVE PROTEIN: CRP: 2 mg/dL — ABNORMAL HIGH (ref <1.0)

## 2021-05-29 LAB — GLUCOSE, CAPILLARY: Glucose-Capillary: 85 mg/dL (ref 70–99)

## 2021-05-29 LAB — MAGNESIUM: Magnesium: 2.3 mg/dL (ref 1.7–2.4)

## 2021-05-29 LAB — TSH: TSH: 3.231 u[IU]/mL (ref 0.350–4.500)

## 2021-05-29 LAB — FERRITIN: Ferritin: 177 ng/mL (ref 24–336)

## 2021-05-29 MED ORDER — ONDANSETRON HCL 4 MG PO TABS: 4.0000 mg | ORAL_TABLET | Freq: Four times a day (QID) | ORAL | Status: DC | PRN

## 2021-05-29 MED ORDER — ACETAMINOPHEN 325 MG PO TABS: 650.0000 mg | ORAL_TABLET | Freq: Four times a day (QID) | ORAL | Status: DC | PRN

## 2021-05-29 MED ORDER — ACETAMINOPHEN 650 MG RE SUPP
650.0000 mg | Freq: Four times a day (QID) | RECTAL | Status: DC | PRN
Start: 2021-05-29 — End: 2021-05-31

## 2021-05-29 MED ORDER — MOMETASONE FURO-FORMOTEROL FUM 100-5 MCG/ACT IN AERO
2.0000 | INHALATION_SPRAY | Freq: Two times a day (BID) | RESPIRATORY_TRACT | Status: DC
  Administered 2021-05-29 – 2021-05-30 (×3): 2 via RESPIRATORY_TRACT
  Filled 2021-05-29: qty 8.8

## 2021-05-29 MED ORDER — ALBUTEROL SULFATE HFA 108 (90 BASE) MCG/ACT IN AERS: 2.0000 | INHALATION_SPRAY | Freq: Four times a day (QID) | RESPIRATORY_TRACT | Status: DC | PRN

## 2021-05-29 MED ORDER — ALBUTEROL SULFATE HFA 108 (90 BASE) MCG/ACT IN AERS
2.0000 | INHALATION_SPRAY | Freq: Four times a day (QID) | RESPIRATORY_TRACT | Status: DC
  Administered 2021-05-29 (×2): 2 via RESPIRATORY_TRACT
  Filled 2021-05-29: qty 6.7

## 2021-05-29 MED ORDER — POLYETHYLENE GLYCOL 3350 17 G PO PACK: 17.0000 g | PACK | Freq: Every day | ORAL | Status: DC | PRN

## 2021-05-29 MED ORDER — SODIUM CHLORIDE 0.9 % IV SOLN: INTRAVENOUS | Status: DC

## 2021-05-29 MED ORDER — ONDANSETRON HCL 4 MG/2ML IJ SOLN: 4.0000 mg | Freq: Four times a day (QID) | INTRAMUSCULAR | Status: DC | PRN

## 2021-05-29 MED ORDER — ASPIRIN 81 MG PO CHEW
81.0000 mg | CHEWABLE_TABLET | Freq: Every day | ORAL | Status: DC
  Administered 2021-05-29 – 2021-05-30 (×2): 81 mg via ORAL
  Filled 2021-05-29 (×2): qty 1

## 2021-05-29 MED ORDER — PREDNISONE 5 MG PO TABS
5.0000 mg | ORAL_TABLET | Freq: Every day | ORAL | Status: DC
  Administered 2021-05-29 – 2021-05-30 (×2): 5 mg via ORAL
  Filled 2021-05-29 (×2): qty 1

## 2021-05-29 MED ORDER — HYDROCOD POLST-CPM POLST ER 10-8 MG/5ML PO SUER: 5.0000 mL | Freq: Two times a day (BID) | ORAL | Status: DC | PRN

## 2021-05-29 MED ORDER — TRAMADOL HCL 50 MG PO TABS: 50.0000 mg | ORAL_TABLET | Freq: Four times a day (QID) | ORAL | Status: DC | PRN

## 2021-05-29 MED ORDER — ENOXAPARIN SODIUM 40 MG/0.4ML IJ SOSY
40.0000 mg | PREFILLED_SYRINGE | INTRAMUSCULAR | Status: DC
  Administered 2021-05-29 – 2021-05-30 (×2): 40 mg via SUBCUTANEOUS
  Filled 2021-05-29 (×2): qty 0.4

## 2021-05-29 MED ORDER — GUAIFENESIN-DM 100-10 MG/5ML PO SYRP: 10.0000 mL | ORAL_SOLUTION | ORAL | Status: DC | PRN

## 2021-05-29 NOTE — Hospital Course (Addendum)
Mr. Dubie is a 75 yo male with PMH Wegener's (on chronic prednisone 5 mg daily), adrenal insufficiency 2/2 chronic prednisone use, aflutter s/p ablation, COPD who presented to the hospital after 2 syncopal episodes at home.  He states both episodes were when he was in the bathroom and had finished using the shower and having a bowel movement.  He also endorsed that he had an episode a couple years ago also associated with being in the bathroom.  He was brought to the hospital and underwent further work-up.  He was found to be bradycardic with normal rhythm.  Review of prior office notes from care everywhere show he is chronically bradycardic in the mid 37s. Orthostatic blood pressure was also negative on admission. He denied any significant symptoms associated with COVID, aside from a mild headache.  He was found to be COVID-positive on testing on admission.  Due to his adrenal insufficiency, Wegener's, he was started on Paxlovid.   He was evaluated by cardiology.  Etiology still possibly related to vasovagal response.  Given his recent long travel and COVID diagnosis, he underwent CTA chest to rule out PE which was also negative.

## 2021-05-29 NOTE — Assessment & Plan Note (Addendum)
Given association of these episodes happening in the bathroom after having either been in the shower or having a bowel movement, vasovagal is also favored (maybe some contribution of covid as well?).  Orthostatics negative on admission.  Other considered etiology would be symptomatic bradycardia although prior office notes show his heart rate is at baseline in the 50s and he has not been symptomatic previously -Checking echo to evaluate for other structural abnormalities.  EKG unremarkable for conduction defects.  - echo also reassuring  - Cardiology consulted for opinion as well. CTA chest obtained, negative for PE. - at this time major etiologies have been ruled out.  - this may still be vasovagal given they keep happening when uses the bathroom (not everytime but the episodes all have using bathroom in common); other consideration would be possibly needing some workup more with adrenals since he has been on chronic prednisone. He plans to follow up back home

## 2021-05-29 NOTE — Assessment & Plan Note (Signed)
-   s/p ablation - now sinus brady

## 2021-05-29 NOTE — Assessment & Plan Note (Addendum)
-   due to chronic steroid use - continue prednisone

## 2021-05-29 NOTE — Assessment & Plan Note (Addendum)
-   followed outpatient back home - continue prednisone  -Per care everywhere note: Ocular, joint, and pulmonary involvement.  Followed by rheumatology, pulmonary, ophthalmology

## 2021-05-29 NOTE — Progress Notes (Signed)
Progress Note    Timothy Chase   WUX:[id number]  DOB: 11-27-1946  DOA: 05/28/2021     0  PCP: System, Provider Not In  CC: passed out  Hospital Course: [name] is a 75 yo male with PMH Wegener's (on chronic prednisone 5 mg daily), adrenal insufficiency 2/2 chronic prednisone use, aflutter s/p ablation, COPD who presented to the hospital after 2 syncopal episodes at home.  He states both episodes were when he was in the bathroom and had finished using the shower and having a bowel movement.  He also endorsed that he had an episode a couple years ago also associated with being in the bathroom.  He was brought to the hospital and underwent further work-up.  He was found to be bradycardic with normal rhythm.  Review of prior office notes from care everywhere show he is chronically bradycardic in the mid 11s. Orthostatic blood pressure was also negative on admission. He denied any significant symptoms associated with COVID, aside from a mild headache.  He was found to be COVID-positive on testing on admission.  Due to his adrenal insufficiency, Wegener's, he was started on Paxlovid.    Interval History:  Resting in bed when seen this morning.  Feeling back to normal state other than a mild headache.  He confirmed HPI noting that his episodes have occurred in the bathroom.  He endorsed a history of colon cancer and does have some urgency when going to the restroom to have a bowel movement due to some decreased control.  Vagal response is leading theory at this time given his story/presentation.   ROS: Constitutional: negative for chills and fevers, Respiratory: negative for cough, Cardiovascular: negative for chest pain, and Gastrointestinal: negative for abdominal pain  Assessment & Plan: * Syncope Given association of these episodes happening in the bathroom after having either been in the shower or having a bowel movement, vasovagal is also favored (maybe some contribution of covid as  well?).  Orthostatics negative on admission.  Other considered etiology would be symptomatic bradycardia although prior office notes show his heart rate is at baseline in the 50s and he has not been symptomatic previously -Checking echo to evaluate for other structural abnormalities.  EKG unremarkable for conduction defects - Cardiology consulted for opinion on bradycardia if felt to be cause of syncope - follow up echo, continue tele  COVID-19 virus infection - Mostly asymptomatic, no respiratory symptoms.  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  Followed by rheumatology, pulmonary, ophthalmology    Old records reviewed in assessment of this patient  Antimicrobials: Paxlovid 6/11 >> current  DVT prophylaxis: enoxaparin (LOVENOX) injection 40 mg Start: 05/29/21 1000 SCDs Start: 05/29/21 0038   Code Status:   Code Status: Full Code Family Communication:   Disposition Plan: Status is: Observation  The patient will require care spanning > 2 midnights and should be moved to inpatient because: Ongoing diagnostic testing needed not appropriate for outpatient work up and Inpatient level of care appropriate due to severity of illness  Dispo: The patient is from: Home  Anticipated d/c is to: Home              Patient currently is not medically stable to d/c.   Difficult to place patient No  Risk of unplanned readmission score:     Objective: Blood pressure (!) 156/63, pulse (!) 55, temperature 98.8 F (37.1 C),  temperature source Oral, resp. rate 14, height 5\' 8"  (1.727 m), weight 74.7 kg, SpO2 97 %.  Examination: General appearance: alert, cooperative, and no distress Head: Normocephalic, without obvious abnormality, atraumatic Eyes:  EOMI Lungs: clear to auscultation bilaterally Heart: S1, S2 normal and bradycardic, regular rhythm Abdomen: normal findings: bowel sounds normal and soft, non-tender Extremities:  no edema Skin: mobility and turgor normal Neurologic: Grossly normal  Consultants:  Cardiology  Procedures:    Data Reviewed: I have personally reviewed following labs and imaging studies Results for orders placed or performed during the hospital encounter of 05/28/21 (from the past 24 hour(s))  Troponin I (High Sensitivity)     Status: None   Collection Time: 05/28/21  5:19 PM  Result Value Ref Range   Troponin I (High Sensitivity) 6 <18 ng/L  Resp Panel by RT-PCR (Flu A&B, Covid) Nasopharyngeal Swab     Status: Abnormal   Collection Time: 05/28/21  6:44 PM   Specimen: Nasopharyngeal Swab; Nasopharyngeal(NP) swabs in vial transport medium  Result Value Ref Range   SARS Coronavirus 2 by RT PCR POSITIVE (A) NEGATIVE   Influenza A by PCR NEGATIVE NEGATIVE   Influenza B by PCR NEGATIVE NEGATIVE  Troponin I (High Sensitivity)     Status: None   Collection Time: 05/28/21  9:54 PM  Result Value Ref Range   Troponin I (High Sensitivity) 5 <18 ng/L  Comprehensive metabolic panel     Status: Abnormal   Collection Time: 05/29/21  4:10 AM  Result Value Ref Range   Sodium 139 135 - 145 mmol/L   Potassium 4.0 3.5 - 5.1 mmol/L   Chloride 105 98 - 111 mmol/L   CO2 26 22 - 32 mmol/L   Glucose, Bld 80 70 - 99 mg/dL   BUN 24 (H) 8 - 23 mg/dL   Creatinine, Ser 1.15 0.61 - 1.24 mg/dL   Calcium 8.2 (L) 8.9 - 10.3 mg/dL   Total Protein 5.6 (L) 6.5 - 8.1 g/dL   Albumin 3.0 (L) 3.5 - 5.0 g/dL   AST 22 15 - 41 U/L   ALT 15 0 - 44 U/L   Alkaline Phosphatase 42 38 - 126 U/L   Total Bilirubin  0.4 0.3 - 1.2 mg/dL   GFR, Estimated >60 >60 mL/min   Anion gap 8 5 - 15  Magnesium     Status: None   Collection Time: 05/29/21  4:10 AM  Result Value Ref Range   Magnesium 2.3 1.7 - 2.4 mg/dL  TSH     Status: None   Collection Time: 05/29/21  4:10 AM  Result Value Ref Range   TSH 3.231 0.350 - 4.500 uIU/mL  C-reactive protein     Status: Abnormal   Collection Time: 05/29/21  4:10 AM  Result Value Ref Range   CRP 2.0 (H) <1.0 mg/dL  Ferritin     Status: None   Collection Time: 05/29/21  4:10 AM  Result Value Ref Range   Ferritin 177 24 - 336 ng/mL  Urinalysis, Routine w reflex microscopic Urine, Clean Catch     Status: None   Collection Time: 05/29/21  4:21 AM  Result Value Ref Range  Color, Urine YELLOW YELLOW   APPearance CLEAR CLEAR   Specific Gravity, Urine 1.015 1.005 - 1.030   pH 5.0 5.0 - 8.0   Glucose, UA NEGATIVE NEGATIVE mg/dL   Hgb urine dipstick NEGATIVE NEGATIVE   Bilirubin Urine NEGATIVE NEGATIVE   Ketones, ur NEGATIVE NEGATIVE mg/dL   Protein, ur NEGATIVE NEGATIVE mg/dL   Nitrite NEGATIVE NEGATIVE   Leukocytes,Ua NEGATIVE NEGATIVE  Glucose, capillary     Status: None   Collection Time: 05/29/21  7:38 AM  Result Value Ref Range   Glucose-Capillary 85 70 - 99 mg/dL    Recent Results (from the past 240 hour(s))  Resp Panel by RT-PCR (Flu A&B, Covid) Nasopharyngeal Swab     Status: Abnormal   Collection Time: 05/28/21  6:44 PM   Specimen: Nasopharyngeal Swab; Nasopharyngeal(NP) swabs in vial transport medium  Result Value Ref Range Status   SARS Coronavirus 2 by RT PCR POSITIVE (A) NEGATIVE Final    Comment: RESULT CALLED TO, READ BACK BY AND VERIFIED WITH:  POWELL,V RN @1948  05/28/21 EDENSCA (NOTE) SARS-CoV-2 target nucleic acids are DETECTED.  The SARS-CoV-2 RNA is generally detectable in upper respiratory specimens during the acute phase of infection. Positive results are indicative of the presence of the identified virus, but do not rule out  bacterial infection or co-infection with other pathogens not detected by the test. Clinical correlation with patient history and other diagnostic information is necessary to determine patient infection status. The expected result is Negative.  Fact Sheet for Patients: EntrepreneurPulse.com.au  Fact Sheet for Healthcare Providers: IncredibleEmployment.be  This test is not yet approved or cleared by the [place] FDA and  has been authorized for detection and/or diagnosis of SARS-CoV-2 by FDA under an Emergency Use Authorization (EUA).  This EUA will remain in effect (meaning this test can b e used) for the duration of  the COVID-19 declaration under Section 564(b)(1) of the Act, 21 U.S.C. section 360bbb-3(b)(1), unless the authorization is terminated or revoked sooner.     Influenza A by PCR NEGATIVE NEGATIVE Final   Influenza B by PCR NEGATIVE NEGATIVE Final    Comment: (NOTE) The Xpert Xpress SARS-CoV-2/FLU/RSV plus assay is intended as an aid in the diagnosis of influenza from Nasopharyngeal swab specimens and should not be used as a sole basis for treatment. Nasal washings and aspirates are unacceptable for Xpert Xpress SARS-CoV-2/FLU/RSV testing.  Fact Sheet for Patients: EntrepreneurPulse.com.au  Fact Sheet for Healthcare Providers: IncredibleEmployment.be  This test is not yet approved or cleared by the [place] FDA and has been authorized for detection and/or diagnosis of SARS-CoV-2 by FDA under an Emergency Use Authorization (EUA). This EUA will remain in effect (meaning this test can be used) for the duration of the COVID-19 declaration under Section 564(b)(1) of the Act, 21 U.S.C. section 360bbb-3(b)(1), unless the authorization is terminated or revoked.  Performed at [facility], [address], [place], [place] [address]      Radiology Studies: DG Chest [place] 1  View  Result Date: 05/28/2021 CLINICAL DATA:  Syncope EXAM: PORTABLE CHEST 1 VIEW COMPARISON:  None. FINDINGS: The cardiomediastinal silhouette is the upper limits of normal in contour. No pleural effusion. No pneumothorax. No acute pleuroparenchymal abnormality. Visualized abdomen is unremarkable. No acute osseous abnormality noted. IMPRESSION: No acute cardiopulmonary abnormality. Electronically Signed   By: Valentino Saxon MD   On: 05/28/2021 13:52   DG Chest Port 1 View  Final Result      Scheduled Meds:  aspirin  81 mg Oral Daily   enoxaparin (LOVENOX) injection  40 mg Subcutaneous Q24H   mometasone-formoterol  2 puff Inhalation BID   nirmatrelvir/ritonavir EUA  2 tablet Oral BID   predniSONE  5 mg Oral Q breakfast   PRN Meds: acetaminophen **OR** acetaminophen, albuterol, chlorpheniramine-HYDROcodone, guaiFENesin-dextromethorphan, ondansetron **OR** ondansetron (ZOFRAN) IV, polyethylene glycol, traMADol Continuous Infusions:  sodium chloride 75 mL/hr at 05/29/21 0137     LOS: 0 days  Time spent: Greater than 50% of the 35 minute visit was spent in counseling/coordination of care for the patient as laid out in the A&P.   Dwyane Dee, MD [facility] 05/29/2021, 1:37 PM

## 2021-05-29 NOTE — H&P (Signed)
TRH H&P    Patient Demographics:    Timothy Chase, is a 75 y.o. male  MRN: [id number]  DOB - 26-Aug-1946  Admit Date - 05/28/2021  Referring MD/NP/PA: Med Cnt [facility] Pt  Outpatient Primary MD for the patient is System, Provider Not In  Patient coming from: Home  Chief complaint- syncope   HPI:    Timothy Chase  is a 75 y.o. male, with history of Wegener's disease, adrenal insufficiency, COPD, presents the ER with a chief complaint of syncope.  Patient reports he has had 2 episodes of syncope in the last 24 hours.  The first episode was when he got out of bed early in the morning to use the bathroom, had a large bowel movement, and then felt diaphoretic, flushed, and lightheaded.  He laid down on the floor and blacked out.  He is not sure how much time passed.  He still felt shaky when he woke up, he felt very weak.  Clean himself up and went back to bed.  When he woke up for the day he noted a headache that was relieved with aspirin.  He felt like his normal headache.  He was otherwise back to his normal state of health.  This morning he took a shower, and then when he was bending over the sink brushing his teeth he felt diaphoretic, flushed, and lightheaded.  He reports that he again ended up on the floor and is not sure how much time went by.  Patient denies hitting his head.  He denies any preceding chest pain, palpitations, dyspnea.  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  He has not had any trouble with it since then.  Today he continued to feel generalized weakness and fatigue, so he presented to the ER.  Patient does have a history of Wegener's disease for which he takes prednisone.  He reports that it is affected his hearing and he has bony aches from it.  He reports that he is currently considered to be in remission.  He has adrenal insufficiency from chronic steroid  use.  Patient smoked 30 years ago, he does not use alcohol, he does not use illicit drugs, he is not vaccinated for COVID.  Patient is full code.  In the ED  Patient is afebrile with heart rates in the 30S to 92Z, systolic blood pressures in the 140s to 150s, oxygen saturation in the 90s on room air.  Orthostatic vital signs were not indicative of orthostatic hypotension.  Labs revealed a BUN of 26, creatinine of 1.56 with an unclear baseline.  Patient is from [place] state and chart review is limited.  He had a glucose of 115.  CBC showed no leukocytosis with a white blood cell count of 5.9, hemoglobin 13.8.  Troponins were flat x2.  D-dimer was 0.58-normal for age adjustment.  Patient was given 1 L normal saline bolus and admission was requested for further work-up of syncope.  Patient's admission COVID test was incidentally positive.  Review of systems:    In addition to the HPI above,  No Fever-chills, Admits to chronic headache no changes with Vision or hearing, No problems swallowing food or Liquids, No Chest pain, Cough or Shortness of Breath, No Abdominal pain, No Nausea or Vomiting, bowel movements are regular, No Blood in stool or Urine, No dysuria, No new skin rashes or bruises, No new joints pains-aches,  No new weakness, tingling, numbness in any extremity, No recent weight gain or loss, No polyuria, polydypsia or polyphagia, No significant Mental Stressors.  All other systems reviewed and are negative.    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 No pertinent family history. Family history hypertension   Home Medications:   Prior to Admission medications   Medication Sig Start Date End Date Taking? Authorizing Provider  alendronate (FOSAMAX) 70 MG tablet Take 70 mg by mouth once a week. 04/18/21  Yes [provider]  cholecalciferol (VITAMIN D3) 25 MCG (1000 UNIT) tablet Take 1,000 Units by mouth daily.   Yes [provider]  Multiple Vitamins-Minerals (ZINC PO) Take 1 tablet by mouth daily.   Yes [provider]  predniSONE (DELTASONE) 5 MG tablet Take 5 mg by mouth in the morning. 04/11/16  Yes [provider]     Allergies:    No Known Allergies   Physical Exam:   Vitals  Blood pressure (!) 146/58, pulse 67, temperature 98.4 F (36.9 C), temperature source Oral, resp. rate 20, height 5\' 8"  (1.727 m), weight 74.7 kg, SpO2 99 %. 1.  General: Patient lying supine in bed,  no acute distress   2. Psychiatric: Alert and oriented x 3, mood and behavior normal for situation, pleasant and cooperative with exam   3. Neurologic: Speech and language are normal, face is symmetric, moves all 4 extremities voluntarily, at baseline without acute deficits on limited exam   4. HEENMT:  Head is atraumatic, normocephalic, pupils reactive to light, neck is supple, trachea is midline, mucous membranes are moist   5. Respiratory : Lungs are clear to auscultation bilaterally without wheezing, rhonchi, rales, no cyanosis, no increase in work of breathing or accessory muscle use   6. Cardiovascular : Heart rate bradycardic, rhythm is regular, slight systolic murmur, no rubs or gallops, no peripheral edema, peripheral pulses palpated   7. Gastrointestinal:  Abdomen is soft, nondistended, nontender to palpation bowel sounds active, no masses or organomegaly palpated   8. Skin:  Skin is warm, dry and intact without rashes, acute lesions, or ulcers on limited  exam   9.Musculoskeletal:  No acute deformities or trauma, no asymmetry in tone, no peripheral edema, peripheral pulses palpated, no tenderness to palpation in the extremities     Data Review:    CBC Recent Labs  Lab 05/28/21 1333  WBC 5.9  HGB 13.8  HCT 42.5  PLT 219  MCV 93.6  MCH 30.4  MCHC 32.5  RDW 14.0  LYMPHSABS 0.5*  MONOABS 0.7  EOSABS 0.0  BASOSABS 0.0   ------------------------------------------------------------------------------------------------------------------  Results for orders placed or performed during the hospital encounter of 05/28/21 (from the past 48 hour(s))  CBG monitoring, ED     Status: None   Collection Time: 05/28/21  1:26 PM  Result Value Ref Range   Glucose-Capillary 85 70 - 99 mg/dL    Comment: Glucose reference range applies only to samples taken after fasting for at least 8 hours.   Comment 1 Notify RN   Basic metabolic panel     Status: Abnormal   Collection Time: 05/28/21  1:33 PM  Result Value Ref Range   Sodium 138 135 - 145 mmol/L   Potassium 4.6 3.5 - 5.1 mmol/L   Chloride 101 98 - 111 mmol/L   CO2 28 22 - 32 mmol/L   Glucose, Bld 115 (H) 70 - 99 mg/dL    Comment: Glucose reference range applies only to samples taken after fasting for at least 8 hours.   BUN 26 (H) 8 - 23 mg/dL   Creatinine, Ser 1.56 (H) 0.61 - 1.24 mg/dL   Calcium 8.3 (L) 8.9 - 10.3 mg/dL   GFR, Estimated 46 (L) >60 mL/min    Comment: (NOTE) Calculated using the CKD-EPI Creatinine Equation (2021)    Anion gap 9 5 - 15    Comment: Performed at [facility], [address] [place], [place] [address]  CBC WITH DIFFERENTIAL     Status: Abnormal   Collection Time: 05/28/21  1:33 PM  Result Value Ref Range   WBC 5.9 4.0 - 10.5 K/uL   RBC 4.54 4.22 - 5.81 MIL/uL   Hemoglobin 13.8 13.0 - 17.0 g/dL   HCT 42.5 39.0 - 52.0 %   MCV 93.6 80.0 - 100.0 fL   MCH 30.4 26.0 - 34.0 pg   MCHC 32.5 30.0 - 36.0 g/dL   RDW 14.0 11.5 - 15.5 %   Platelets  219 150 - 400 K/uL   nRBC 0.0 0.0 - 0.2 %   Neutrophils Relative % 78 %   Neutro Abs 4.6 1.7 - 7.7 K/uL   Lymphocytes Relative 8 %   Lymphs Abs 0.5 (L) 0.7 - 4.0 K/uL   Monocytes Relative 13 %   Monocytes Absolute 0.7 0.1 - 1.0 K/uL   Eosinophils Relative 0 %   Eosinophils Absolute 0.0 0.0 - 0.5 K/uL   Basophils Relative 0 %   Basophils Absolute 0.0 0.0 - 0.1 K/uL   Immature Granulocytes 1 %   Abs Immature Granulocytes 0.03 0.00 - 0.07 K/uL    Comment: Performed at [facility], [address] [place], [place] [address]  Sedimentation rate     Status: Abnormal   Collection Time: 05/28/21  1:33 PM  Result Value Ref Range   Sed Rate 18 (H) 0 - 16 mm/hr    Comment: Performed at [facility], [address] [place], [place] [address]  Brain natriuretic peptide     Status: None   Collection Time: 05/28/21  1:33 PM  Result Value Ref Range   B Natriuretic Peptide 74.9 0.0 - 100.0 pg/mL    Comment: Performed at [facility], [address] [place], [place] [address]  D-dimer, quantitative     Status: Abnormal   Collection Time: 05/28/21  1:33 PM  Result Value Ref Range  D-Dimer, Quant 0.58 (H) 0.00 - 0.50 ug/mL-FEU    Comment: (NOTE) At the manufacturer cut-off value of 0.5 g/mL FEU, this assay has a negative predictive value of 95-100%.This assay is intended for use in conjunction with a clinical pretest probability (PTP) assessment model to exclude pulmonary embolism (PE) and deep venous thrombosis (DVT) in outpatients suspected of PE or DVT. Results should be correlated with clinical presentation. Performed at [facility], [address] [place], [place] [address]   Troponin I (High Sensitivity)     Status: None   Collection Time: 05/28/21  1:33 PM  Result Value Ref Range   Troponin I (High Sensitivity) 6 <18 ng/L    Comment: (NOTE) Elevated high sensitivity troponin I (hsTnI) values and significant  changes across serial  measurements may suggest ACS but many other  chronic and acute conditions are known to elevate hsTnI results.  Refer to the "Links" section for chest pain algorithms and additional  guidance. Performed at [facility], [address] [place], [place] [address]   Troponin I (High Sensitivity)     Status: None   Collection Time: 05/28/21  5:19 PM  Result Value Ref Range   Troponin I (High Sensitivity) 6 <18 ng/L    Comment: (NOTE) Elevated high sensitivity troponin I (hsTnI) values and significant  changes across serial measurements may suggest ACS but many other  chronic and acute conditions are known to elevate hsTnI results.  Refer to the "Links" section for chest pain algorithms and additional  guidance. Performed at [facility], [address] [place], [place] [address]   Resp Panel by RT-PCR (Flu A&B, Covid) Nasopharyngeal Swab     Status: Abnormal   Collection Time: 05/28/21  6:44 PM   Specimen: Nasopharyngeal Swab; Nasopharyngeal(NP) swabs in vial transport medium  Result Value Ref Range   SARS Coronavirus 2 by RT PCR POSITIVE (A) NEGATIVE    Comment: RESULT CALLED TO, READ BACK BY AND VERIFIED WITH:  POWELL,V RN @1948  05/28/21 EDENSCA (NOTE) SARS-CoV-2 target nucleic acids are DETECTED.  The SARS-CoV-2 RNA is generally detectable in upper respiratory specimens during the acute phase of infection. Positive results are indicative of the presence of the identified virus, but do not rule out bacterial infection or co-infection with other pathogens not detected by the test. Clinical correlation with patient history and other diagnostic information is necessary to determine patient infection status. The expected result is Negative.  Fact Sheet for Patients: EntrepreneurPulse.com.au  Fact Sheet for Healthcare Providers: IncredibleEmployment.be  This test is not yet approved or cleared by the [place] FDA and   has been authorized for detection and/or diagnosis of SARS-CoV-2 by FDA under an Emergency Use Authorization (EUA).  This EUA will remain in effect (meaning this test can b e used) for the duration of  the COVID-19 declaration under Section 564(b)(1) of the Act, 21 U.S.C. section 360bbb-3(b)(1), unless the authorization is terminated or revoked sooner.     Influenza A by PCR NEGATIVE NEGATIVE   Influenza B by PCR NEGATIVE NEGATIVE    Comment: (NOTE) The Xpert Xpress SARS-CoV-2/FLU/RSV plus assay is intended as an aid in the diagnosis of influenza from Nasopharyngeal swab specimens and should not be used as a sole basis for treatment. Nasal washings and aspirates are unacceptable for Xpert Xpress SARS-CoV-2/FLU/RSV testing.  Fact Sheet for Patients: EntrepreneurPulse.com.au  Fact Sheet for Healthcare Providers: IncredibleEmployment.be  This test is not yet approved or cleared by  the [employer] and has been authorized for detection and/or diagnosis of SARS-CoV-2 by FDA under an Emergency Use Authorization (EUA). This EUA will remain in effect (meaning this test can be used) for the duration of the COVID-19 declaration under Section 564(b)(1) of the Act, 21 U.S.C. section 360bbb-3(b)(1), unless the authorization is terminated or revoked.  Performed at [facility], [address] [place], [place] [address]   Troponin I (High Sensitivity)     Status: None   Collection Time: 05/28/21  9:54 PM  Result Value Ref Range   Troponin I (High Sensitivity) 5 <18 ng/L    Comment: (NOTE) Elevated high sensitivity troponin I (hsTnI) values and significant  changes across serial measurements may suggest ACS but many other  chronic and acute conditions are known to elevate hsTnI results.  Refer to the "Links" section for chest pain algorithms and additional  guidance. Performed at [facility], [address] [place], [place] [address]     Chemistries  Recent Labs  Lab 05/28/21 1333  NA 138  K 4.6  CL 101  CO2 28  GLUCOSE 115*  BUN 26*  CREATININE 1.56*  CALCIUM 8.3*   ------------------------------------------------------------------------------------------------------------------  ------------------------------------------------------------------------------------------------------------------ GFR: Estimated Creatinine Clearance: 39 mL/min (A) (by C-G formula based on SCr of 1.56 mg/dL (H)). Liver Function Tests: No results for input(s): AST, ALT, ALKPHOS, BILITOT, PROT, ALBUMIN in the last 168 hours. No results for input(s): LIPASE, AMYLASE in the last 168 hours. No results for input(s): AMMONIA in the last 168 hours. Coagulation Profile: No results for input(s): INR, PROTIME in the last 168 hours. Cardiac Enzymes: No results for input(s): CKTOTAL, CKMB, CKMBINDEX, TROPONINI in the last 168 hours. BNP (last 3 results) No results for input(s): PROBNP in the last 8760 hours. HbA1C: No results for input(s): HGBA1C in the last 72 hours. CBG: Recent Labs  Lab 05/28/21 1326  GLUCAP 85   Lipid Profile: No results for input(s): CHOL, HDL, LDLCALC, TRIG, CHOLHDL, LDLDIRECT in the last 72 hours. Thyroid Function Tests: No results for input(s): TSH, T4TOTAL, FREET4, T3FREE, THYROIDAB in the last 72 hours. Anemia Panel: No results for input(s): VITAMINB12, FOLATE, FERRITIN, TIBC, IRON, RETICCTPCT in the last 72 hours.  --------------------------------------------------------------------------------------------------------------- Urine analysis: No results found for: COLORURINE, APPEARANCEUR, LABSPEC, PHURINE, GLUCOSEU, HGBUR, BILIRUBINUR, KETONESUR, PROTEINUR, UROBILINOGEN, NITRITE, LEUKOCYTESUR    Imaging Results:    DG Chest Port 1 View  Result Date: 05/28/2021 CLINICAL DATA:  Syncope EXAM: PORTABLE CHEST 1 VIEW COMPARISON:  None. FINDINGS: The cardiomediastinal  silhouette is the upper limits of normal in contour. No pleural effusion. No pneumothorax. No acute pleuroparenchymal abnormality. Visualized abdomen is unremarkable. No acute osseous abnormality noted. IMPRESSION: No acute cardiopulmonary abnormality. Electronically Signed   By: Valentino Saxon MD   On: 05/28/2021 13:52    My personal review of EKG: Sinus bradycardia with a rate of 54/min, QTc 399 ,no Acute ST changes   Assessment & Plan:    Principal Problem:   Syncope Active Problems:   COVID-19 virus infection   Wegener's granulomatosis with renal involvement (HCC)   Adrenal insufficiency (Addison's disease) (HCC)   Syncope Most likely vasovagal Does have history of arrhythmia Monitor on telemetry Recent cross-country travel raises suspicion for blood clot, D-dimer is normal when adjusted for age Sinus bradycardia on EKG -not on any beta-blockers, troponin stable at 6, 6 -syncope could be symptomatic bradycardia? -Appreciate cardiology input Chest x-ray is without acute findings Echo in the a.m. Orthostatic  vital signs reported the normal Continue to monitor COVID No respiratory symptoms Start Paxil 10 Wegener's Continue prednisone Adrenal insufficiency Secondary to prednisone for Wegener's disease, continue prednisone    DVT Prophylaxis-   Lovenox - SCDs   AM Labs Ordered, also please review Full Orders  Family Communication: No family at bedside Code Status:  Full  Admission status: Observation  Time spent in minutes : [place] DO

## 2021-05-29 NOTE — Assessment & Plan Note (Signed)
-   Mostly asymptomatic, no respiratory symptoms.  Does complain of a headache -continue paxlovid

## 2021-05-29 NOTE — Assessment & Plan Note (Signed)
-   Has been relatively longstanding after reviewing previous office notes in care everywhere back to 2021

## 2021-05-30 ENCOUNTER — Inpatient Hospital Stay (HOSPITAL_COMMUNITY): Payer: Medicare Other

## 2021-05-30 ENCOUNTER — Encounter (HOSPITAL_COMMUNITY): Payer: Self-pay | Admitting: Internal Medicine

## 2021-05-30 DIAGNOSIS — R55 Syncope and collapse: Secondary | ICD-10-CM

## 2021-05-30 LAB — CBC WITH DIFFERENTIAL/PLATELET
Abs Immature Granulocytes: 0.01 10*3/uL (ref 0.00–0.07)
Basophils Absolute: 0 10*3/uL (ref 0.0–0.1)
Basophils Relative: 0 %
Eosinophils Absolute: 0 10*3/uL (ref 0.0–0.5)
Eosinophils Relative: 0 %
HCT: 40.7 % (ref 39.0–52.0)
Hemoglobin: 13.1 g/dL (ref 13.0–17.0)
Immature Granulocytes: 0 %
Lymphocytes Relative: 25 %
Lymphs Abs: 0.7 10*3/uL (ref 0.7–4.0)
MCH: 30.3 pg (ref 26.0–34.0)
MCHC: 32.2 g/dL (ref 30.0–36.0)
MCV: 94.2 fL (ref 80.0–100.0)
Monocytes Absolute: 0.4 10*3/uL (ref 0.1–1.0)
Monocytes Relative: 14 %
Neutro Abs: 1.7 10*3/uL (ref 1.7–7.7)
Neutrophils Relative %: 61 %
Platelets: 204 10*3/uL (ref 150–400)
RBC: 4.32 MIL/uL (ref 4.22–5.81)
RDW: 13.8 % (ref 11.5–15.5)
WBC: 2.8 10*3/uL — ABNORMAL LOW (ref 4.0–10.5)
nRBC: 0 % (ref 0.0–0.2)

## 2021-05-30 LAB — BASIC METABOLIC PANEL
Anion gap: 6 (ref 5–15)
BUN: 19 mg/dL (ref 8–23)
CO2: 26 mmol/L (ref 22–32)
Calcium: 8.1 mg/dL — ABNORMAL LOW (ref 8.9–10.3)
Chloride: 107 mmol/L (ref 98–111)
Creatinine, Ser: 1.15 mg/dL (ref 0.61–1.24)
GFR, Estimated: >60 mL/min (ref >60)
Glucose, Bld: 82 mg/dL (ref 70–99)
Potassium: 4 mmol/L (ref 3.5–5.1)
Sodium: 139 mmol/L (ref 135–145)

## 2021-05-30 LAB — ECHOCARDIOGRAM COMPLETE
Area-P 1/2: 3.2 cm2
Calc EF: 55.2 %
Height: 68 in
S' Lateral: 3.2 cm
Single Plane A2C EF: 51.2 %
Single Plane A4C EF: 58 %
Weight: 2634.94 oz

## 2021-05-30 LAB — C-REACTIVE PROTEIN: CRP: 1 mg/dL — ABNORMAL HIGH (ref <1.0)

## 2021-05-30 LAB — FERRITIN: Ferritin: 176 ng/mL (ref 24–336)

## 2021-05-30 LAB — MAGNESIUM: Magnesium: 1.9 mg/dL (ref 1.7–2.4)

## 2021-05-30 MED ORDER — IOHEXOL 350 MG/ML SOLN
75.0000 mL | Freq: Once | INTRAVENOUS | Status: AC | PRN
  Administered 2021-05-30: 75 mL via INTRAVENOUS

## 2021-05-30 MED ORDER — NIRMATRELVIR/RITONAVIR (PAXLOVID)TABLET: 2.0000 | ORAL_TABLET | Freq: Two times a day (BID) | ORAL | 0 refills | Status: AC

## 2021-05-30 MED ORDER — PREDNISONE 5 MG PO TABS: 5.0000 mg | ORAL_TABLET | Freq: Every morning | ORAL | 1 refills | Status: DC

## 2021-05-30 NOTE — Plan of Care (Signed)
  Problem: Education: Goal: Knowledge of General Education information will improve Description: Including pain rating scale, medication(s)/side effects and non-pharmacologic comfort measures Outcome: Adequate for Discharge   Problem: Health Behavior/Discharge Planning: Goal: Ability to manage health-related needs will improve Outcome: Adequate for Discharge   Problem: Clinical Measurements: Goal: Ability to maintain clinical measurements within normal limits will improve Outcome: Adequate for Discharge Goal: Will remain free from infection Outcome: Adequate for Discharge Goal: Diagnostic test results will improve Outcome: Adequate for Discharge Goal: Respiratory complications will improve Outcome: Adequate for Discharge Goal: Cardiovascular complication will be avoided Outcome: Adequate for Discharge   Problem: Coping: Goal: Level of anxiety will decrease Outcome: Adequate for Discharge   Problem: Pain Managment: Goal: General experience of comfort will improve Outcome: Adequate for Discharge   Problem: Safety: Goal: Ability to remain free from injury will improve Outcome: Adequate for Discharge   Problem: Education: Goal: Knowledge of risk factors and measures for prevention of condition will improve Outcome: Adequate for Discharge   Problem: Coping: Goal: Psychosocial and spiritual needs will be supported Outcome: Adequate for Discharge   Problem: Respiratory: Goal: Will maintain a patent airway Outcome: Adequate for Discharge Goal: Complications related to the disease process, condition or treatment will be avoided or minimized Outcome: Adequate for Discharge

## 2021-05-30 NOTE — Consult Note (Addendum)
Cardiology Consultation:   Patient ID: Timothy Chase MRN: [id number]; DOB: 27-Feb-1946  Admit date: 05/28/2021 Date of Consult: 05/30/2021  PCP:  System, Provider Not In   [facility] Providers Cardiologist:  None   (Dr. Radene Knee in [place] [address]) Click here to update MD or APP on Care Team, Refresh:1}     Patient Profile:   Timothy Chase is a 75 y.o. male with a hx of remote atrial flutter s/p ablation 2014, colorectal CA s/p hemicolectomy, Wegener's granulomatosis (ocular, joint, pulmonary involvement), adrenal insufficiency, baseline sinus bradycardia who is being seen 05/30/2021 for the evaluation of syncope at the request of Dr. Sabino Gasser.  History of Present Illness:   Timothy Chase is here visiting from [place] state, flew in on Tuesday 05/24/21, came to see his sister. He was remotely followed in [place] for atrial flutter and had a successful ablation in 2014 without subsequent events thereafter. His baseline HR in EMR appears to have been 50s-50s after that time. He has not had any further palpitations so had not seen them in recent years. He does have a prior history of syncope similar to this presenting episode, about 2 episodes 4-5 years ago which both occurred around the time of having to use the bathroom.  On Friday 6/10 he got up at 4am to use the bathroom. He sat down and started to have a BM. He suddenly felt "like I was in shock" - very sweaty. The next thing he knew he was on the floor with unwitnessed LOC. He got up, still felt very sweaty, so went back to bed. Later in the day he felt a mild headache for which he took ASA but otherwise felt better. On Saturday he took a shower and while standing at the sink felt similar symptoms, very sweaty and "went down again," unwitnessed LOC for unclear amount of time. No B/B incontinence, palpitations, chest pain or dyspnea surround the event. He got cleaned up and laid down. Did not have ability to check VS with either event. Home Covid test  was positive. Later that afternoon he decided he out to seek care so came to the ED. Here PCR confirmed Covid +. He has no respiratory symptoms or fever, chills or cough. Denies any change in oral intake, nausea, vomiting or diarrhea. Labs showed negative troponin x 3, normal TSH, normal UA, normal BNP, AKI with Cr 1.56 (improved to 1.15), mildly elevated d-dimer 0.55. CXR NAD. 2D echo today showed EF 55-60%, mild LVH, normal RV, mild-moderate aortic sclerosis without stenosis. Orthostatics after admission were unrevealing. He is being treated with Paxlovid. HR was noted to be in the 40s at times without associated symptoms. Cardiology called to assess.  Addendum: Patient had another episode of feeling woozy - telemetry showed development of sinus tach to the 120s followed by resolution in a short span of time (did not appear to be AF/AFL - gradual ramp up/down) - BP 180/75 during event. Will repeat EKG to trend.   Past Medical History:  Diagnosis Date   Adrenal insufficiency (Addison's disease) ([place])    Atrial flutter ([place])    a. ablation 2014.   Cancer [facility])    Colon   Wegener's granulomatosis with renal involvement ([place])     Past Surgical History:  Procedure Laterality Date   COLON SURGERY       Home Medications:  Prior to Admission medications   Medication Sig Start Date End Date Taking? Authorizing Provider  alendronate (FOSAMAX) 70 MG tablet Take 70 mg by mouth  once a week. 04/18/21  Yes [provider]  cholecalciferol (VITAMIN D3) 25 MCG (1000 UNIT) tablet Take 1,000 Units by mouth daily.   Yes [provider]  Multiple Vitamins-Minerals (ZINC PO) Take 1 tablet by mouth daily.   Yes [provider]  predniSONE (DELTASONE) 5 MG tablet Take 5 mg by mouth in the morning. 04/11/16  Yes [provider]    Inpatient Medications: Scheduled Meds:  aspirin  81 mg Oral Daily   enoxaparin (LOVENOX) injection  40 mg Subcutaneous Q24H    mometasone-formoterol  2 puff Inhalation BID   nirmatrelvir/ritonavir EUA  2 tablet Oral BID   predniSONE  5 mg Oral Q breakfast   Continuous Infusions:  sodium chloride 75 mL/hr at 05/29/21 0137   PRN Meds: acetaminophen **OR** acetaminophen, albuterol, chlorpheniramine-HYDROcodone, guaiFENesin-dextromethorphan, ondansetron **OR** ondansetron (ZOFRAN) IV, polyethylene glycol, traMADol  Allergies:   No Known Allergies  Social History:   Social History   Socioeconomic History   Marital status: Married    Spouse name: Not on file   Number of children: Not on file   Years of education: Not on file   Highest education level: Not on file  Occupational History   Not on file  Tobacco Use   Smoking status: Former    Pack years: 0.00    Types: Cigarettes   Smokeless tobacco: Never   Tobacco comments:    Quit 35 years ago, smoked 5-10 yrs  Vaping Use   Vaping Use: Former  Substance and Sexual Activity   Alcohol use: Never   Drug use: Never   Sexual activity: Not on file  Other Topics Concern   Not on file  Social History Narrative   Not on file   Social Determinants of Health   Financial Resource Strain: Not on file  Food Insecurity: Not on file  Transportation Needs: No Transportation Needs   Lack of Transportation (Medical): No   Lack of Transportation (Non-Medical): No  Physical Activity: Not on file  Stress: Not on file  Social Connections: Not on file  Intimate Partner Violence: Not At Risk   Fear of Current or Ex-Partner: No   Emotionally Abused: No   Physically Abused: No   Sexually Abused: No    Family History:    Family History  Problem Relation Age of Onset   CAD Neg Hx      ROS:  Please see the history of present illness.  All other ROS reviewed and negative.     Physical Exam/Data:   Vitals:   05/29/21 1036 05/29/21 1242 05/29/21 1954 05/30/21 0426  BP: 137/66 (!) 156/63 (!) 158/68 (!) 160/63  Pulse: (!) 58 (!) 55 (!) 48 (!) 43  Resp: 18 14  18 16   Temp: 99.1 F (37.3 C) 98.8 F (37.1 C) 98.3 F (36.8 C) 98.2 F (36.8 C)  TempSrc: Oral Oral Oral Oral  SpO2: 97% 97% 100% 96%  Weight:      Height:        Intake/Output Summary (Last 24 hours) at 05/30/2021 0949 Last data filed at 05/29/2021 1500 Gross per 24 hour  Intake 738.69 ml  Output 1300 ml  Net -561.31 ml   Last 3 Weights 05/29/2021 05/28/2021 05/28/2021  Weight (lbs) 164 lb 10.9 oz 164 lb 10.9 oz 160 lb  Weight (kg) 74.7 kg 74.7 kg 72.576 kg     Body mass index is 25.04 kg/m.  General: Well developed, well nourished WM in no acute distress. Head: Normocephalic, atraumatic,  sclera non-icteric, no xanthomas, nares are without discharge. Neck: Negative for carotid bruits. JVP not elevated. Lungs: Clear bilaterally to auscultation without wheezes, rales, or rhonchi. Breathing is unlabored. Heart: RRR S1 S2 without murmurs, rubs, or gallops.  Abdomen: Soft, non-tender, non-distended with normoactive bowel sounds. No rebound/guarding. Extremities: No clubbing or cyanosis. No edema. Distal pedal pulses are 2+ and equal bilaterally. Neuro: Alert and oriented X 3. Moves all extremities spontaneously. Psych:  Responds to questions appropriately with a normal affect.   EKG:  The EKG was personally reviewed and demonstrates:  SB 54bpm nonspecific ST changes, nonacute Telemetry:  Telemetry was personally reviewed and demonstrates:  SB since admission then HR in the 40s this AM 41-49bpm  Relevant CV Studies: 2D echo today  1. Left ventricular ejection fraction, by estimation, is 55 to 60%. The  left ventricle has normal function. The left ventricle has no regional  wall motion abnormalities. There is mild left ventricular hypertrophy.  Left ventricular diastolic parameters  are consistent with Grade I diastolic dysfunction (impaired relaxation).   2. Right ventricular systolic function is normal. The right ventricular  size is normal. Tricuspid regurgitation signal is  inadequate for assessing  PA pressure.   3. The mitral valve is normal in structure. Trivial mitral valve  regurgitation. No evidence of mitral stenosis.   4. The aortic valve is tricuspid. Aortic valve regurgitation is not  visualized. Mild to moderate aortic valve sclerosis/calcification is  present, without any evidence of aortic stenosis.   5. The inferior vena cava is normal in size with greater than 50%  respiratory variability, suggesting right atrial pressure of 3 mmHg.   Laboratory Data:  High Sensitivity Troponin:   Recent Labs  Lab 05/28/21 1333 05/28/21 1719 05/28/21 2154  TROPONINIHS 6 6 5      Chemistry Recent Labs  Lab 05/28/21 1333 05/29/21 0410 05/30/21 0347  NA 138 139 139  K 4.6 4.0 4.0  CL 101 105 107  CO2 28 26 26   GLUCOSE 115* 80 82  BUN 26* 24* 19  CREATININE 1.56* 1.15 1.15  CALCIUM 8.3* 8.2* 8.1*  GFRNONAA 46* >60 >60  ANIONGAP 9 8 6     Recent Labs  Lab 05/29/21 0410  PROT 5.6*  ALBUMIN 3.0*  AST 22  ALT 15  ALKPHOS 42  BILITOT 0.4   Hematology Recent Labs  Lab 05/28/21 1333 05/30/21 0347  WBC 5.9 2.8*  RBC 4.54 4.32  HGB 13.8 13.1  HCT 42.5 40.7  MCV 93.6 94.2  MCH 30.4 30.3  MCHC 32.5 32.2  RDW 14.0 13.8  PLT 219 204   BNP Recent Labs  Lab 05/28/21 1333  BNP 74.9    DDimer  Recent Labs  Lab 05/28/21 1333  DDIMER 0.58*     Radiology/Studies:  DG Chest Port 1 View  Result Date: 05/28/2021 CLINICAL DATA:  Syncope EXAM: PORTABLE CHEST 1 VIEW COMPARISON:  None. FINDINGS: The cardiomediastinal silhouette is the upper limits of normal in contour. No pleural effusion. No pneumothorax. No acute pleuroparenchymal abnormality. Visualized abdomen is unremarkable. No acute osseous abnormality noted. IMPRESSION: No acute cardiopulmonary abnormality. Electronically Signed   By: Valentino Saxon MD   On: 05/28/2021 13:52   ECHOCARDIOGRAM COMPLETE  Result Date: 05/30/2021    ECHOCARDIOGRAM REPORT   Patient Name:   Timothy Chase Date of Exam: 05/30/2021 Medical Rec #:  [id number]  Height:       68.0 in Accession #:    [id number] Weight:  164.7 lb Date of Birth:  [birth year]   BSA:          1.882 m Patient Age:    57 years   BP:           160/63 mmHg Patient Gender: M          HR:           43 bpm. Exam Location:  Inpatient Procedure: 2D Echo, 3D Echo, Cardiac Doppler and Color Doppler Indications:    R55 Syncope  History:        Patient has no prior history of Echocardiogram examinations.                 Abnormal ECG, COPD; Arrythmias:Atrial Flutter. Covid positive.                 Bradycardia. Cancer.  Sonographer:    Roseanna Rainbow RDCS Referring Phys: [id number] ASIA B [place]  1. Left ventricular ejection fraction, by estimation, is 55 to 60%. The left ventricle has normal function. The left ventricle has no regional wall motion abnormalities. There is mild left ventricular hypertrophy. Left ventricular diastolic parameters are consistent with Grade I diastolic dysfunction (impaired relaxation).  2. Right ventricular systolic function is normal. The right ventricular size is normal. Tricuspid regurgitation signal is inadequate for assessing PA pressure.  3. The mitral valve is normal in structure. Trivial mitral valve regurgitation. No evidence of mitral stenosis.  4. The aortic valve is tricuspid. Aortic valve regurgitation is not visualized. Mild to moderate aortic valve sclerosis/calcification is present, without any evidence of aortic stenosis.  5. The inferior vena cava is normal in size with greater than 50% respiratory variability, suggesting right atrial pressure of 3 mmHg. FINDINGS  Left Ventricle: Left ventricular ejection fraction, by estimation, is 55 to 60%. The left ventricle has normal function. The left ventricle has no regional wall motion abnormalities. The left ventricular internal cavity size was normal in size. There is  mild left ventricular hypertrophy. Left ventricular diastolic parameters are  consistent with Grade I diastolic dysfunction (impaired relaxation). Right Ventricle: The right ventricular size is normal. No increase in right ventricular wall thickness. Right ventricular systolic function is normal. Tricuspid regurgitation signal is inadequate for assessing PA pressure. Left Atrium: Left atrial size was normal in size. Right Atrium: Right atrial size was normal in size. Pericardium: There is no evidence of pericardial effusion. Presence of pericardial fat pad. Mitral Valve: The mitral valve is normal in structure. Trivial mitral valve regurgitation. No evidence of mitral valve stenosis. Tricuspid Valve: The tricuspid valve is normal in structure. Tricuspid valve regurgitation is trivial. Aortic Valve: The aortic valve is tricuspid. Aortic valve regurgitation is not visualized. Mild to moderate aortic valve sclerosis/calcification is present, without any evidence of aortic stenosis. Pulmonic Valve: The pulmonic valve was not well visualized. Pulmonic valve regurgitation is not visualized. Aorta: The aortic root and ascending aorta are structurally normal, with no evidence of dilitation. Venous: The inferior vena cava is normal in size with greater than 50% respiratory variability, suggesting right atrial pressure of 3 mmHg. IAS/Shunts: The interatrial septum was not well visualized.  LEFT VENTRICLE PLAX 2D LVIDd:         4.50 cm     Diastology LVIDs:         3.20 cm     LV e' medial:    5.44 cm/s LV PW:         1.30 cm     LV  E/e' medial:  13.5 LV IVS:        1.10 cm     LV e' lateral:   5.77 cm/s LVOT diam:     2.00 cm     LV E/e' lateral: 12.7 LV SV:         90 LV SV Index:   48 LVOT Area:     3.14 cm  LV Volumes (MOD) LV vol d, MOD A2C: 93.1 ml LV vol d, MOD A4C: 85.2 ml LV vol s, MOD A2C: 45.4 ml LV vol s, MOD A4C: 35.8 ml LV SV MOD A2C:     47.7 ml LV SV MOD A4C:     85.2 ml LV SV MOD BP:      49.7 ml RIGHT VENTRICLE            IVC RV S prime:     9.40 cm/s  IVC diam: 1.60 cm TAPSE  (M-mode): 1.9 cm LEFT ATRIUM             Index       RIGHT ATRIUM           Index LA diam:        3.90 cm 2.07 cm/m  RA Area:     12.30 cm LA Vol (A2C):   35.3 ml 18.76 ml/m RA Volume:   24.20 ml  12.86 ml/m LA Vol (A4C):   31.9 ml 16.95 ml/m LA Biplane Vol: 36.8 ml 19.56 ml/m  AORTIC VALVE LVOT Vmax:   111.00 cm/s LVOT Vmean:  71.600 cm/s LVOT VTI:    0.285 m  AORTA Ao Root diam: 3.50 cm MITRAL VALVE MV Area (PHT): 3.20 cm    SHUNTS MV Decel Time: 237 msec    Systemic VTI:  0.29 m MV E velocity: 73.30 cm/s  Systemic Diam: 2.00 cm MV A velocity: 86.50 cm/s MV E/A ratio:  0.85 Oswaldo Milian MD Electronically signed by Oswaldo Milian MD Signature Date/Time: 05/30/2021/8:51:39 AM    Final      Assessment and Plan:   1. Syncope, also with sinus bradycardia noted on telemetry - etiology of syncope not totally clear at this time, found to be Covid + with AKI on admission - initial HR/orthostatics unrevealing but has since been found to have periodic HRs in the 40s on telemetry - asymptomatic with this this morning - we have seen some episodes of syncope with Covid leading to some conduction system disease but so far he has not had any evidence of more sustained AV block or pauses - also note he has prior history of this 4-5 years ago as well - echo reassuring - TSH wnl - will discuss further evaluation with MD - discussed no driving until cleared by his medical team back home - would benefit from cardiology f/u back in [place] as well  2. Covid-19 infection - on Paxlovid per primary team - note elevated d-dimer - not tachycardic, hypoxic, or SOB so PE seems less likely - RV is also normal, will review with MD  3. Remote history of atrial flutter s/p ablation 2014 - no known recurrence, no longer on [place]  4. H/o adrenal insufficiency - defer steroid dosing to IM  5. AKI - improved, ? Reflecting dehydration/pre-renal state leading to syncope  6. Elevated BP without prior dx of  HTN - previous BPs 2014-2015 borderline hypertensive at times - patient states it fluctuated in the past - may be prudent to manage conservatively at this time given syncope and  f/u as OP  Risk Assessment/Risk Scores:      CHA2DS2-VASc Score = 2  This indicates a 2.2% annual risk of stroke. The patient's score is based upon: CHF History: No HTN History: No Diabetes History: No Stroke History: No Vascular Disease History: No Age Score: 2 Gender Score: 0     For questions or updates, please contact [place] Please consult [email or web address] for contact info under    Signed, Charlie Pitter, PA-C  05/30/2021 55:25 AM   75 year old with COVID, recent plane flight from [place] with syncope.  Back on Friday at 4 AM he went up to use the bathroom sat down had a bowel movement and felt suddenly felt like he was in shock very sweaty.  Next thing he knew he was on the floor.  On Saturday took a shower and while standing at the sink felt similar symptoms very sweaty then went down again.  His D-dimer was 0.55 minimally elevated.  Chest x-ray personally reviewed shows no disease.  Echocardiogram also personally reviewed and interpreted shows normal ejection fraction with normal RV findings.  Currently being treated with Paxlovid.  Earlier this morning our team was called to the room again feeling woozy telemetry showed sinus tachycardia to the 120s with resolution.  Blood pressure when taken just after he started feeling poorly was 180/75.  GEN: Well nourished, well developed, in no acute distress HEENT: normal Neck: no JVD, carotid bruits, or masses Cardiac: RRR; no murmurs, rubs, or gallops,no edema  Respiratory:  clear to auscultation bilaterally, normal work of breathing GI: soft, nontender, nondistended, + BS MS: no deformity or atrophy Skin: warm and dry, no rash Neuro:  Alert and Oriented x 3, Strength and sensation are intact Psych: euthymic mood, full affect  Assessment  and plan:  Syncope - Previously noted to have sinus bradycardia heart rate 40s for instance but he was asymptomatic at that time. -His symptomatic episode today was accompanied with hypertension as well as tachycardia. -Certainly this could be a manifestation of vasovagal type symptoms but given his concomitant COVID-19 infection, remote history of atrial flutter status post ablation in 2014 with no oral anticoagulation currently, and minimally elevated D-dimer with unexplained syncope, I think it makes sense for [place] to go ahead and get a CTA of chest to rule out pulmonary embolism given his possible hyper thrombotic state.  These types of symptoms can be a manifestation of PE.  Otherwise, work-up has been fairly unremarkable to this point.  He has not shown any significant pauses or further AV block. - Understands no driving at this point.  In course, he will continue to be monitored by his cardiologist in [place] as well. - Also, appeared to have acute kidney injury on presentation as well as history of adrenal insufficiency which is being adequately treated.  This could all be playing a role as well.  Candee Furbish, MD

## 2021-05-30 NOTE — Discharge Summary (Signed)
Physician Discharge Summary   Timothy Chase QTM:[id number] DOB: 1946/10/04 DOA: 05/28/2021  PCP: System, Provider Not In  Admit date: 05/28/2021 Discharge date: 05/30/2021  Admitted From: home Disposition:  home Discharging physician: Dwyane Dee, MD  Recommendations for Outpatient Follow-up:  Follow up with cardiology back  home Follow up with PCP May need repeat ACTH stim test or adjustment of prednisone vs changing to hydrocortisone if further syncope. No definitve etiology discovered but adrenal/endocrine still on differential.   Home Health:  Equipment/Devices:   Patient discharged to home in Discharge Condition: stable Risk of unplanned readmission score: Unplanned Admission- Pilot do not use: 10.51  CODE STATUS: Full Diet recommendation:  Diet Orders (From admission, onward)     Start     Ordered   05/30/21 0000  Diet - low sodium heart healthy        05/30/21 1756   05/29/21 0038  Diet Heart Room service appropriate? Yes; Fluid consistency: Thin  Diet effective now       Question Answer Comment  Room service appropriate? 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  He states both episodes were when he was in the bathroom and had finished using the shower and having a bowel movement.  He also endorsed that he had an episode a couple years ago also associated with being in the bathroom.  He was brought to the hospital and underwent further work-up.  He was found to be bradycardic with normal rhythm.  Review of prior office notes from care everywhere show he is chronically bradycardic in the mid 32s. Orthostatic blood pressure was also negative on admission. He denied any significant symptoms associated with COVID,  aside from a mild headache.  He was found to be COVID-positive on testing on admission.  Due to his adrenal insufficiency, Wegener's, he was started on Paxlovid.   He was evaluated by cardiology.  Etiology still possibly related to vasovagal response.  Given his recent long travel and COVID diagnosis, he underwent CTA chest to rule out PE which was also negative.  * Syncope Given association of these episodes happening in the bathroom after having either been in the shower or having a bowel movement, vasovagal is also favored (maybe some contribution of covid as well?).  Orthostatics negative on admission.  Other considered etiology would be symptomatic bradycardia although prior office notes show his heart rate is at baseline in the 50s and he has not been symptomatic previously -Checking echo to evaluate for other structural abnormalities.  EKG unremarkable for conduction defects.  - echo also reassuring  - Cardiology consulted for opinion as well. CTA chest obtained, negative for PE. - at this time major etiologies have been ruled out.  - this may still be vasovagal given they keep happening when uses the bathroom (not everytime but the episodes all have using bathroom in common); other consideration would be possibly needing some workup more with adrenals since he has been on chronic prednisone. He plans to follow up back home  COVID-19 virus infection - Mostly asymptomatic, no respiratory symptoms.  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   Followed by rheumatology, pulmonary, ophthalmology   The patient's chronic medical conditions were treated accordingly per the patient's home medication regimen except as noted.  On day of discharge, patient was felt deemed stable for discharge. Patient/family member advised to call PCP or come back to ER if needed.   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    Discharge Instructions     Diet - low sodium heart healthy   Complete by: As directed    Increase activity slowly   Complete by: As directed       Allergies as of 05/30/2021   No Known Allergies      Medication List     TAKE these medications    alendronate 70 MG tablet Commonly known as: FOSAMAX Take 70 mg by mouth once a week.   cholecalciferol 25 MCG (1000 UNIT) tablet Commonly known as: VITAMIN D3 Take 1,000 Units by mouth daily.   nirmatrelvir/ritonavir EUA Tabs Commonly known as: PAXLOVID Take 2 tablets by mouth 2 (two) times daily for 6 doses. Patient GFR is >60. Take nirmatrelvir (150 mg) two tablets twice daily for 5 days and ritonavir (100 mg) one tablet twice daily for 5 days.   predniSONE 5 MG tablet Commonly known as: DELTASONE Take 5 mg by mouth in the morning.   ZINC PO Take 1 tablet by mouth daily.        No Known Allergies  Consultations: Cardiology  Discharge Exam: BP (!) 165/82 (BP Location: Right Arm)   Pulse (!) 49   Temp 98.4 F (36.9 C)   Resp 20   Ht 5\' 8"  (1.727 m)   Wt 74.7 kg   SpO2 100%   BMI 25.04 kg/m  General appearance: alert, cooperative, and no  distress Head: Normocephalic, without obvious abnormality, atraumatic Eyes:  EOMI Lungs: clear to auscultation bilaterally Heart: S1, S2 normal and bradycardic, regular rhythm Abdomen: normal findings: bowel sounds normal and soft, non-tender Extremities:  no edema Skin: mobility and turgor normal Neurologic: Grossly normal  The results of significant diagnostics from this hospitalization (including imaging, microbiology, ancillary and laboratory) are listed below for reference.   Microbiology: Recent Results (from the past 240 hour(s))  Resp Panel by RT-PCR (Flu A&B, Covid) Nasopharyngeal Swab     Status: Abnormal   Collection Time: 05/28/21  6:44 PM   Specimen: Nasopharyngeal Swab; Nasopharyngeal(NP) swabs in vial transport medium  Result Value Ref Range Status   SARS Coronavirus 2 by RT PCR POSITIVE (A) NEGATIVE Final    Comment: RESULT CALLED TO, READ BACK BY AND VERIFIED WITH:  POWELL,V RN @1948  05/28/21 EDENSCA (NOTE) SARS-CoV-2 target nucleic acids are DETECTED.  The SARS-CoV-2 RNA is generally detectable in upper respiratory specimens during the acute phase of infection. Positive results  are indicative of the presence of the identified virus, but do not rule out bacterial infection or co-infection with other pathogens not detected by the test. Clinical correlation with patient history and other diagnostic information is necessary to determine patient infection status. The expected result is Negative.  Fact Sheet for Patients: EntrepreneurPulse.com.au  Fact Sheet for Healthcare Providers: IncredibleEmployment.be  This test is not yet approved or cleared by the [place] FDA and  has been authorized for detection and/or diagnosis of SARS-CoV-2 by FDA under an Emergency Use Authorization (EUA).  This EUA will remain in effect (meaning this test can b e used) for the duration of  the COVID-19 declaration under Section 564(b)(1) of the  Act, 21 U.S.C. section 360bbb-3(b)(1), unless the authorization is terminated or revoked sooner.     Influenza A by PCR NEGATIVE NEGATIVE Final   Influenza B by PCR NEGATIVE NEGATIVE Final    Comment: (NOTE) The Xpert Xpress SARS-CoV-2/FLU/RSV plus assay is intended as an aid in the diagnosis of influenza from Nasopharyngeal swab specimens and should not be used as a sole basis for treatment. Nasal washings and aspirates are unacceptable for Xpert Xpress SARS-CoV-2/FLU/RSV testing.  Fact Sheet for Patients: EntrepreneurPulse.com.au  Fact Sheet for Healthcare Providers: IncredibleEmployment.be  This test is not yet approved or cleared by the [place] FDA and has been authorized for detection and/or diagnosis of SARS-CoV-2 by FDA under an Emergency Use Authorization (EUA). This EUA will remain in effect (meaning this test can be used) for the duration of the COVID-19 declaration under Section 564(b)(1) of the Act, 21 U.S.C. section 360bbb-3(b)(1), unless the authorization is terminated or revoked.  Performed at [facility], [address] [place], [place] [address]      Labs: BNP (last 3 results) Recent Labs    05/28/21 1333  BNP 27.0   Basic Metabolic Panel: Recent Labs  Lab 05/28/21 1333 05/29/21 0410 05/30/21 0347  NA 138 139 139  K 4.6 4.0 4.0  CL 101 105 107  CO2 28 26 26   GLUCOSE 115* 80 82  BUN 26* 24* 19  CREATININE 1.56* 1.15 1.15  CALCIUM 8.3* 8.2* 8.1*  MG  --  2.3 1.9   Liver Function Tests: Recent Labs  Lab 05/29/21 0410  AST 22  ALT 15  ALKPHOS 42  BILITOT 0.4  PROT 5.6*  ALBUMIN 3.0*   No results for input(s): LIPASE, AMYLASE in the last 168 hours. No results for input(s): AMMONIA in the last 168 hours. CBC: Recent Labs  Lab 05/28/21 1333 05/30/21 0347  WBC 5.9 2.8*  NEUTROABS 4.6 1.7  HGB 13.8 13.1  HCT 42.5 40.7  MCV 93.6 94.2  PLT 219 204   Cardiac Enzymes: No results  for input(s): CKTOTAL, CKMB, CKMBINDEX, TROPONINI in the last 168 hours. BNP: Invalid input(s): POCBNP CBG: Recent Labs  Lab 05/28/21 1326 05/29/21 0738  GLUCAP 85 85   D-Dimer Recent Labs    05/28/21 1333  DDIMER 0.58*   Hgb A1c No results for input(s): HGBA1C in the last 72 hours. Lipid Profile No results for input(s): CHOL, HDL, LDLCALC, TRIG, CHOLHDL, LDLDIRECT in the last 72 hours. Thyroid function studies Recent Labs    05/29/21 0410  TSH 3.231   Anemia work up Recent Labs    05/29/21 0410 05/30/21 0347  FERRITIN 177 176   Urinalysis    Component Value Date/Time   COLORURINE YELLOW 05/29/2021 0421   APPEARANCEUR CLEAR 05/29/2021 0421   LABSPEC 1.015 05/29/2021  0421   PHURINE 5.0 05/29/2021 0421   GLUCOSEU NEGATIVE 05/29/2021 0421   HGBUR NEGATIVE 05/29/2021 0421   BILIRUBINUR NEGATIVE 05/29/2021 0421   KETONESUR NEGATIVE 05/29/2021 0421   PROTEINUR NEGATIVE 05/29/2021 0421   NITRITE NEGATIVE 05/29/2021 0421   LEUKOCYTESUR NEGATIVE 05/29/2021 0421   Sepsis Labs Invalid input(s): PROCALCITONIN,  WBC,  LACTICIDVEN Microbiology Recent Results (from the past 240 hour(s))  Resp Panel by RT-PCR (Flu A&B, Covid) Nasopharyngeal Swab     Status: Abnormal   Collection Time: 05/28/21  6:44 PM   Specimen: Nasopharyngeal Swab; Nasopharyngeal(NP) swabs in vial transport medium  Result Value Ref Range Status   SARS Coronavirus 2 by RT PCR POSITIVE (A) NEGATIVE Final    Comment: RESULT CALLED TO, READ BACK BY AND VERIFIED WITH:  POWELL,V RN @1948  05/28/21 EDENSCA (NOTE) SARS-CoV-2 target nucleic acids are DETECTED.  The SARS-CoV-2 RNA is generally detectable in upper respiratory specimens during the acute phase of infection. Positive results are indicative of the presence of the identified virus, but do not rule out bacterial infection or co-infection with other pathogens not detected by the test. Clinical correlation with patient history and other diagnostic  information is necessary to determine patient infection status. The expected result is Negative.  Fact Sheet for Patients: EntrepreneurPulse.com.au  Fact Sheet for Healthcare Providers: IncredibleEmployment.be  This test is not yet approved or cleared by the [place] FDA and  has been authorized for detection and/or diagnosis of SARS-CoV-2 by FDA under an Emergency Use Authorization (EUA).  This EUA will remain in effect (meaning this test can b e used) for the duration of  the COVID-19 declaration under Section 564(b)(1) of the Act, 21 U.S.C. section 360bbb-3(b)(1), unless the authorization is terminated or revoked sooner.     Influenza A by PCR NEGATIVE NEGATIVE Final   Influenza B by PCR NEGATIVE NEGATIVE Final    Comment: (NOTE) The Xpert Xpress SARS-CoV-2/FLU/RSV plus assay is intended as an aid in the diagnosis of influenza from Nasopharyngeal swab specimens and should not be used as a sole basis for treatment. Nasal washings and aspirates are unacceptable for Xpert Xpress SARS-CoV-2/FLU/RSV testing.  Fact Sheet for Patients: EntrepreneurPulse.com.au  Fact Sheet for Healthcare Providers: IncredibleEmployment.be  This test is not yet approved or cleared by the [place] FDA and has been authorized for detection and/or diagnosis of SARS-CoV-2 by FDA under an Emergency Use Authorization (EUA). This EUA will remain in effect (meaning this test can be used) for the duration of the COVID-19 declaration under Section 564(b)(1) of the Act, 21 U.S.C. section 360bbb-3(b)(1), unless the authorization is terminated or revoked.  Performed at [facility], [address] [place], [place] [address]     Procedures/Studies: CT Angio Chest Pulmonary Embolism (PE) W or WO Contrast  Result Date: 05/30/2021 CLINICAL DATA:  PE suspected, COVID positive EXAM: CT ANGIOGRAPHY CHEST WITH CONTRAST  TECHNIQUE: Multidetector CT imaging of the chest was performed using the standard protocol during bolus administration of intravenous contrast. Multiplanar CT image reconstructions and MIPs were obtained to evaluate the vascular anatomy. CONTRAST:  41mL OMNIPAQUE IOHEXOL 350 MG/ML SOLN COMPARISON:  None. FINDINGS: Cardiovascular: Satisfactory opacification of the pulmonary arteries to the segmental level. No evidence of pulmonary embolism. Mild cardiomegaly. Scattered left coronary artery calcifications. No pericardial effusion. Scattered aortic atherosclerosis. Mediastinum/Nodes: No enlarged mediastinal, hilar, or axillary lymph nodes. Thyroid gland, trachea, and esophagus demonstrate no significant findings. Lungs/Pleura: Diffuse bilateral bronchial wall thickening. No pleural effusion or pneumothorax. Upper Abdomen:  No acute abnormality.  Gallstone in the gallbladder. Musculoskeletal: No chest wall abnormality. No acute or significant osseous findings. Review of the MIP images confirms the above findings. IMPRESSION: 1. Negative examination for pulmonary embolism. 2. Diffuse bilateral bronchial wall thickening, consistent with nonspecific infectious or inflammatory bronchitis. 3. Coronary artery disease. 4. Cholelithiasis. Aortic Atherosclerosis (ICD10-I70.0). Electronically Signed   By: Eddie Candle M.D.   On: 05/30/2021 13:31   DG Chest Port 1 View  Result Date: 05/28/2021 CLINICAL DATA:  Syncope EXAM: PORTABLE CHEST 1 VIEW COMPARISON:  None. FINDINGS: The cardiomediastinal silhouette is the upper limits of normal in contour. No pleural effusion. No pneumothorax. No acute pleuroparenchymal abnormality. Visualized abdomen is unremarkable. No acute osseous abnormality noted. IMPRESSION: No acute cardiopulmonary abnormality. Electronically Signed   By: Valentino Saxon MD   On: 05/28/2021 13:52   ECHOCARDIOGRAM COMPLETE  Result Date: 05/30/2021    ECHOCARDIOGRAM REPORT   Patient Name:   Timothy Chase Date  of Exam: 05/30/2021 Medical Rec #:  [id number]  Height:       68.0 in Accession #:    [id number] Weight:       164.7 lb Date of Birth:  Mar 26, 1946   BSA:          1.882 m Patient Age:    16 years   BP:           160/63 mmHg Patient Gender: M          HR:           43 bpm. Exam Location:  Inpatient Procedure: 2D Echo, 3D Echo, Cardiac Doppler and Color Doppler Indications:    R55 Syncope  History:        Patient has no prior history of Echocardiogram examinations.                 Abnormal ECG, COPD; Arrythmias:Atrial Flutter. Covid positive.                 Bradycardia. Cancer.  Sonographer:    Roseanna Rainbow RDCS Referring Phys: [id number] ASIA B [place]  1. Left ventricular ejection fraction, by estimation, is 55 to 60%. The left ventricle has normal function. The left ventricle has no regional wall motion abnormalities. There is mild left ventricular hypertrophy. Left ventricular diastolic parameters are consistent with Grade I diastolic dysfunction (impaired relaxation).  2. Right ventricular systolic function is normal. The right ventricular size is normal. Tricuspid regurgitation signal is inadequate for assessing PA pressure.  3. The mitral valve is normal in structure. Trivial mitral valve regurgitation. No evidence of mitral stenosis.  4. The aortic valve is tricuspid. Aortic valve regurgitation is not visualized. Mild to moderate aortic valve sclerosis/calcification is present, without any evidence of aortic stenosis.  5. The inferior vena cava is normal in size with greater than 50% respiratory variability, suggesting right atrial pressure of 3 mmHg. FINDINGS  Left Ventricle: Left ventricular ejection fraction, by estimation, is 55 to 60%. The left ventricle has normal function. The left ventricle has no regional wall motion abnormalities. The left ventricular internal cavity size was normal in size. There is  mild left ventricular hypertrophy. Left ventricular diastolic parameters are consistent  with Grade I diastolic dysfunction (impaired relaxation). Right Ventricle: The right ventricular size is normal. No increase in right ventricular wall thickness. Right ventricular systolic function is normal. Tricuspid regurgitation signal is inadequate for assessing PA pressure. Left Atrium: Left atrial size was normal in size. Right Atrium: Right atrial size was  normal in size. Pericardium: There is no evidence of pericardial effusion. Presence of pericardial fat pad. Mitral Valve: The mitral valve is normal in structure. Trivial mitral valve regurgitation. No evidence of mitral valve stenosis. Tricuspid Valve: The tricuspid valve is normal in structure. Tricuspid valve regurgitation is trivial. Aortic Valve: The aortic valve is tricuspid. Aortic valve regurgitation is not visualized. Mild to moderate aortic valve sclerosis/calcification is present, without any evidence of aortic stenosis. Pulmonic Valve: The pulmonic valve was not well visualized. Pulmonic valve regurgitation is not visualized. Aorta: The aortic root and ascending aorta are structurally normal, with no evidence of dilitation. Venous: The inferior vena cava is normal in size with greater than 50% respiratory variability, suggesting right atrial pressure of 3 mmHg. IAS/Shunts: The interatrial septum was not well visualized.  LEFT VENTRICLE PLAX 2D LVIDd:         4.50 cm     Diastology LVIDs:         3.20 cm     LV e' medial:    5.44 cm/s LV PW:         1.30 cm     LV E/e' medial:  13.5 LV IVS:        1.10 cm     LV e' lateral:   5.77 cm/s LVOT diam:     2.00 cm     LV E/e' lateral: 12.7 LV SV:         90 LV SV Index:   48 LVOT Area:     3.14 cm  LV Volumes (MOD) LV vol d, MOD A2C: 93.1 ml LV vol d, MOD A4C: 85.2 ml LV vol s, MOD A2C: 45.4 ml LV vol s, MOD A4C: 35.8 ml LV SV MOD A2C:     47.7 ml LV SV MOD A4C:     85.2 ml LV SV MOD BP:      49.7 ml RIGHT VENTRICLE            IVC RV S prime:     9.40 cm/s  IVC diam: 1.60 cm TAPSE (M-mode): 1.9 cm  LEFT ATRIUM             Index       RIGHT ATRIUM           Index LA diam:        3.90 cm 2.07 cm/m  RA Area:     12.30 cm LA Vol (A2C):   35.3 ml 18.76 ml/m RA Volume:   24.20 ml  12.86 ml/m LA Vol (A4C):   31.9 ml 16.95 ml/m LA Biplane Vol: 36.8 ml 19.56 ml/m  AORTIC VALVE LVOT Vmax:   111.00 cm/s LVOT Vmean:  71.600 cm/s LVOT VTI:    0.285 m  AORTA Ao Root diam: 3.50 cm MITRAL VALVE MV Area (PHT): 3.20 cm    SHUNTS MV Decel Time: 237 msec    Systemic VTI:  0.29 m MV E velocity: 73.30 cm/s  Systemic Diam: 2.00 cm MV A velocity: 86.50 cm/s MV E/A ratio:  0.85 Oswaldo Milian MD Electronically signed by Oswaldo Milian MD Signature Date/Time: 05/30/2021/8:51:39 AM    Final      Time coordinating discharge: Over 30 minutes    Dwyane Dee, MD  [facility] 05/30/2021, 5:56 PM

## 2021-05-30 NOTE — Discharge Instructions (Signed)
Follow up with your primary care and your rheumatologist. You might need repeat adrenal gland workup if continuing to have further passing out episodes.

## 2021-05-30 NOTE — Progress Notes (Signed)
  Echocardiogram 2D Echocardiogram has been performed.  Timothy Chase 05/30/2021, 8:35 AM

## 2022-02-01 ENCOUNTER — Emergency Department (HOSPITAL_COMMUNITY): Payer: Medicare Other

## 2022-02-01 ENCOUNTER — Other Ambulatory Visit: Payer: Self-pay

## 2022-02-01 ENCOUNTER — Emergency Department (HOSPITAL_COMMUNITY)
Admission: EM | Admit: 2022-02-01 | Discharge: 2022-02-01 | Disposition: A | Discharge status: Home / Self Care | Payer: Medicare Other | Attending: Emergency Medicine | Admitting: Emergency Medicine

## 2022-02-01 DIAGNOSIS — S0191XA Laceration without foreign body of unspecified part of head, initial encounter: Secondary | ICD-10-CM

## 2022-02-01 DIAGNOSIS — Z23 Encounter for immunization: Secondary | ICD-10-CM | POA: Diagnosis not present

## 2022-02-01 DIAGNOSIS — S0990XA Unspecified injury of head, initial encounter: Secondary | ICD-10-CM

## 2022-02-01 DIAGNOSIS — S01112A Laceration without foreign body of left eyelid and periocular area, initial encounter: Secondary | ICD-10-CM | POA: Insufficient documentation

## 2022-02-01 DIAGNOSIS — W2201XA Walked into wall, initial encounter: Secondary | ICD-10-CM | POA: Diagnosis not present

## 2022-02-01 DIAGNOSIS — R55 Syncope and collapse: Secondary | ICD-10-CM | POA: Diagnosis present

## 2022-02-01 LAB — CBC WITH DIFFERENTIAL/PLATELET
Abs Immature Granulocytes: 0.04 10*3/uL (ref 0.00–0.07)
Basophils Absolute: 0 10*3/uL (ref 0.0–0.1)
Basophils Relative: 0 %
Eosinophils Absolute: 0.1 10*3/uL (ref 0.0–0.5)
Eosinophils Relative: 1 %
HCT: 41.4 % (ref 39.0–52.0)
Hemoglobin: 13.4 g/dL (ref 13.0–17.0)
Immature Granulocytes: 0 %
Lymphocytes Relative: 6 %
Lymphs Abs: 0.7 10*3/uL (ref 0.7–4.0)
MCH: 30.2 pg (ref 26.0–34.0)
MCHC: 32.4 g/dL (ref 30.0–36.0)
MCV: 93.5 fL (ref 80.0–100.0)
Monocytes Absolute: 1.1 10*3/uL — ABNORMAL HIGH (ref 0.1–1.0)
Monocytes Relative: 10 %
Neutro Abs: 9.4 10*3/uL — ABNORMAL HIGH (ref 1.7–7.7)
Neutrophils Relative %: 83 %
Platelets: 299 10*3/uL (ref 150–400)
RBC: 4.43 MIL/uL (ref 4.22–5.81)
RDW: 13.1 % (ref 11.5–15.5)
WBC: 11.5 10*3/uL — ABNORMAL HIGH (ref 4.0–10.5)
nRBC: 0 % (ref 0.0–0.2)

## 2022-02-01 LAB — BASIC METABOLIC PANEL
Anion gap: 8 (ref 5–15)
BUN: 27 mg/dL — ABNORMAL HIGH (ref 8–23)
CO2: 25 mmol/L (ref 22–32)
Calcium: 8.5 mg/dL — ABNORMAL LOW (ref 8.9–10.3)
Chloride: 106 mmol/L (ref 98–111)
Creatinine, Ser: 1.25 mg/dL — ABNORMAL HIGH (ref 0.61–1.24)
GFR, Estimated: 59 mL/min — ABNORMAL LOW (ref >60)
Glucose, Bld: 99 mg/dL (ref 70–99)
Potassium: 4.7 mmol/L (ref 3.5–5.1)
Sodium: 139 mmol/L (ref 135–145)

## 2022-02-01 MED ORDER — TETANUS-DIPHTH-ACELL PERTUSSIS 5-2.5-18.5 LF-MCG/0.5 IM SUSY
0.5000 mL | PREFILLED_SYRINGE | Freq: Once | INTRAMUSCULAR | Status: AC
  Administered 2022-02-01: 0.5 mL via INTRAMUSCULAR
  Filled 2022-02-01: qty 0.5

## 2022-02-01 MED ORDER — LIDOCAINE-EPINEPHRINE 2 %-1:100000 IJ SOLN
20.0000 mL | Freq: Once | INTRAMUSCULAR | Status: DC
  Filled 2022-02-01: qty 1

## 2022-02-01 NOTE — ED Provider Notes (Signed)
[facility] DEPT Provider Note   CSN: [id number] Arrival date & time: 02/01/22  0354     History  Chief Complaint  Patient presents with   Loss of Consciousness   Head Injury    Timothy Chase is a 76 y.o. male.  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  He states he got up to use the restroom, felt lightheaded, had palpitations, felt nauseated and syncopized.  He did not have any chest pain or shortness of breath at that time or after.  He did sustain head trauma.  He is not anticoagulated.  He has laceration to the left forehead.  He is also complaining of some neck pain.  He denies any other injuries or pain elsewhere.  Patient notes that he has had several episodes of syncope over the last year.  He follows with cardiology and electrophysiology at his home in [place].  He is also been seen by cardiology while he is here in [place] on vacation.  He has a loop recorder in place.  Home Medications Prior to Admission medications   Medication Sig Start Date End Date Taking? Authorizing Provider  alendronate (FOSAMAX) 70 MG tablet Take 70 mg by mouth once a week. 04/18/21   [provider]  cholecalciferol (VITAMIN D3) 25 MCG (1000 UNIT) tablet Take 1,000 Units by mouth daily.    [provider]  Multiple Vitamins-Minerals (ZINC PO) Take 1 tablet by mouth daily.    [provider]  predniSONE (DELTASONE) 5 MG tablet Take 1 tablet (5 mg total) by mouth in the morning. 05/30/21   Dwyane Dee, MD      Allergies    Patient has no known allergies.    Review of Systems   Review of Systems See HPI for pertinent positives or negatives.   Physical Exam Updated Vital Signs BP 140/67    Pulse 64    Temp 98.2 F (36.8 C) (Oral)    Resp 12    Wt 72.6 kg    SpO2 96%    BMI 24.33 kg/m  Physical  Exam Vitals and nursing note reviewed.  Constitutional:      General: He is not in acute distress.    Appearance: He is well-developed.  HENT:     Head: Normocephalic.     Comments: 4cm deep laceration to the left eyebrow area.  Eyes:     Conjunctiva/sclera: Conjunctivae normal.  Cardiovascular:     Rate and Rhythm: Normal rate and regular rhythm.     Heart sounds: Murmur heard.  Pulmonary:     Effort: Pulmonary effort is normal. No respiratory distress.     Breath sounds: Normal breath sounds. No wheezing, rhonchi or rales.  Abdominal:     General: Bowel sounds are normal.     Palpations: Abdomen is soft.     Tenderness: There is no abdominal tenderness.  Musculoskeletal:        General: No swelling.     Cervical back: Neck supple.     Comments: Ccollar in place. Mild ttp to the base of the skull and cervical spine  Skin:    General: Skin is warm and dry.     Capillary Refill: Capillary refill takes less than 2 seconds.  Neurological:     Mental Status: He is alert.     Comments: Mental Status:  Alert, thought content  appropriate, able to give a coherent history. Speech fluent without evidence of aphasia. Able to follow 2 step commands without difficulty.  Cranial Nerves:  HQ:IONGEX equal, round, reactive to light III,IV, VI: ptosis not present, extra-ocular motions intact bilaterally  V,VII: smile symmetric, facial light touch sensation equal VIII: hearing grossly normal to voice  X: uvula elevates symmetrically  XI: bilateral shoulder shrug symmetric and strong XII: midline tongue extension without fassiculations Motor:  Normal tone. 5/5 strength of BUE and BLE major muscle groups including strong and equal grip strength and dorsiflexion/plantar flexion Sensory: light touch normal in all extremities.  Psychiatric:        Mood and Affect: Mood normal.     ED Results / Procedures / Treatments   Labs (all labs ordered are listed, but only abnormal results are  displayed) Labs Reviewed  CBC WITH DIFFERENTIAL/PLATELET - Abnormal; Notable for the following components:      Result Value   WBC 11.5 (*)    Neutro Abs 9.4 (*)    Monocytes Absolute 1.1 (*)    All other components within normal limits  BASIC METABOLIC PANEL - Abnormal; Notable for the following components:   BUN 27 (*)    Creatinine, Ser 1.25 (*)    Calcium 8.5 (*)    GFR, Estimated 59 (*)    All other components within normal limits    EKG EKG Interpretation  Date/Time:  Wednesday February 01 2022 04:46:11 EST Ventricular Rate:  57 PR Interval:  173 QRS Duration: 129 QT Interval:  403 QTC Calculation: 393 R Axis:   -22 Text Interpretation: Sinus rhythm Nonspecific intraventricular conduction delay Inferior infarct, old Lateral leads are also involved Interpretation limited secondary to artifact Confirmed by Thayer Jew [phone]) on 02/01/2022 6:50:33 AM  Radiology CT Head Wo Contrast  Result Date: 02/01/2022 CLINICAL DATA:  76 year old male status post syncope and fall. Struck left side of head. EXAM: CT HEAD WITHOUT CONTRAST TECHNIQUE: Contiguous axial images were obtained from the base of the skull through the vertex without intravenous contrast. RADIATION DOSE REDUCTION: This exam was performed according to the departmental dose-optimization program which includes automated exposure control, adjustment of the mA and/or kV according to patient size and/or use of iterative reconstruction technique. COMPARISON:  Face and cervical spine CT reported separately. FINDINGS: Brain: Cerebral volume is within normal limits for age. No midline shift, ventriculomegaly, mass effect, evidence of mass lesion, intracranial hemorrhage or evidence of cortically based acute infarction. Gray-white matter differentiation is within normal limits for age throughout the brain. Punctate vascular calcification at the right basal ganglia. Vascular: Calcified atherosclerosis at the skull base. No  suspicious intracranial vascular hyperdensity. Skull: No skull fracture identified. Sinuses/Orbits: Chronic right maxillary periosteal thickening. Other visualized paranasal sinuses and mastoids are well aerated. Other: Left supraorbital and more right of midline superior forehead scalp hematomas. Grossly intact orbits, see Face CT reported separately. No scalp soft tissue gas or calvarium fracture identified. IMPRESSION: 1. Anterior scalp hematoma without underlying skull fracture identified. 2. Normal for age non contrast CT appearance of the brain. Electronically Signed   By: Genevie Ann M.D.   On: 02/01/2022 06:14   CT Cervical Spine Wo Contrast  Result Date: 02/01/2022 CLINICAL DATA:  76 year old male status post syncope and fall. Struck left side of head. EXAM: CT CERVICAL SPINE WITHOUT CONTRAST TECHNIQUE: Multidetector CT imaging of the cervical spine was performed without intravenous contrast. Multiplanar CT image reconstructions were also generated. RADIATION DOSE REDUCTION: This exam was performed according  to the departmental dose-optimization program which includes automated exposure control, adjustment of the mA and/or kV according to patient size and/or use of iterative reconstruction technique. COMPARISON:  Head and face CT today. FINDINGS: Alignment: Mild straightening of cervical lordosis. Cervicothoracic junction alignment is within normal limits. Bilateral posterior element alignment is within normal limits. Skull base and vertebrae: Visualized skull base is intact. No atlanto-occipital dissociation. 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 C1 and C2 appear intact and aligned. No acute osseous abnormality identified. Soft tissues and spinal canal: No prevertebral fluid or swelling. No visible canal hematoma. Calcified left carotid atherosclerosis. Subcentimeter right thyroid hypodense nodule Not clinically significant; no follow-up imaging recommended  (ref: J Am Coll Radiol. 2015 Feb;12(2): 143-50). Disc levels: Advanced upper cervical spine facet degeneration, moderate to severe on the right at C2-C3 and on the left at C4-C5. 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 No convincing cervical spinal stenosis. Upper chest: Visible upper thoracic levels appear intact. Negative lung apices, noncontrast visible superior mediastinum. IMPRESSION: 1. No acute traumatic injury identified in the cervical spine. 2. 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 Electronically Signed   By: Genevie Ann M.D.   On: 02/01/2022 06:21   CT Maxillofacial Wo Contrast  Result Date: 02/01/2022 CLINICAL DATA:  76 year old male status post syncope and fall. Struck left side of head. EXAM: CT MAXILLOFACIAL WITHOUT CONTRAST TECHNIQUE: Multidetector CT imaging of the maxillofacial structures was performed. Multiplanar CT image reconstructions were also generated. RADIATION DOSE REDUCTION: This exam was performed according to the departmental dose-optimization program which includes automated exposure control, adjustment of the mA and/or kV according to patient size and/or use of iterative reconstruction technique. COMPARISON:  Head and cervical spine CT today. FINDINGS: Osseous: Mandible intact and normally located. No acute dental finding. Maxilla, zygoma, pterygoid, and nasal bones appear intact. Central skull base intact. Visible calvarium intact. Orbits: Intact orbital walls. Postoperative changes to both globes which appear intact. Intraorbital soft tissues appears symmetric and normal. Left supraorbital superficial soft tissue swelling and stranding. Similar right forehead superficial soft tissue injury. Sinuses: Paranasal sinuses, tympanic cavities and mastoids are well aerated. Previous bilateral maxillary antrostomies, middle turbinectomies. There is chronic maxillary periosteal thickening mostly on the right. Soft  tissues: Calcified cervical carotid atherosclerosis on the left but otherwise negative visible noncontrast deep soft tissue spaces of the face. Limited intracranial: Reported separately. IMPRESSION: 1. Left supraorbital and right forehead superficial soft tissue injury. No underlying facial fracture. 2. Previous paranasal sinus surgery. Electronically Signed   By: Genevie Ann M.D.   On: 02/01/2022 06:18    Procedures .Marland KitchenLaceration Repair  Date/Time: 02/01/2022 6:55 AM Performed by: Rodney Booze, PA-C Authorized by: Rodney Booze, PA-C   Consent:    Consent obtained:  Verbal   Consent given by:  Patient   Risks, benefits, and alternatives were discussed: yes     Risks discussed:  Infection, pain, need for additional repair and poor cosmetic result   Alternatives discussed:  No treatment Universal protocol:    Procedure explained and questions answered to patient or proxy's satisfaction: yes     Immediately prior to procedure, a time out was called: yes     Patient identity confirmed:  Verbally with patient Anesthesia:    Anesthesia method:  Local infiltration   Local anesthetic:  Lidocaine 2% WITH epi Laceration details:    Location: periorbital.   Length (cm):  3 Pre-procedure details:  Preparation:  Patient was prepped and draped in usual sterile fashion and imaging obtained to evaluate for foreign bodies Exploration:    Limited defect created (wound extended): no     Hemostasis achieved with:  Epinephrine   Imaging outcome: foreign body not noted     Wound exploration: wound explored through full range of motion and entire depth of wound visualized     Wound extent: no muscle damage noted (muscle appears intact) and no underlying fracture noted     Contaminated: no   Treatment:    Area cleansed with:  Povidone-iodine and saline   Amount of cleaning:  Standard   Visualized foreign bodies/material removed: no     Debridement:  None   Undermining:  None   Scar revision: no    Skin repair:    Repair method:  Sutures   Suture size:  6-0   Wound skin closure material used: vicryl.   Suture technique:  Simple interrupted   Number of sutures:  6 Approximation:    Approximation:  Close Repair type:    Repair type:  Simple Post-procedure details:    Dressing:  Open (no dressing)   Procedure completion:  Tolerated    Medications Ordered in ED Medications  lidocaine-EPINEPHrine (XYLOCAINE W/EPI) 2 %-1:100000 (with pres) injection 20 mL (has no administration in time range)  Tdap (BOOSTRIX) injection 0.5 mL (0.5 mLs Intramuscular Given 02/01/22 0536)    ED Course/ Medical Decision Making/ A&P                           Medical Decision Making Amount and/or Complexity of Data Reviewed Labs: ordered. Radiology: ordered.  Risk Prescription drug management.   This patient presents to the ED for concern of syncope, this involves an extensive number of treatment options, and is a complaint that carries with it a high risk of complications and morbidity.  The differential diagnosis includes but is not limited to cardiogenic syncope, neurogenic syncope, head injury/ich, laceration   Comorbidities that complicate the patient evaluation: Patients presentation is complicated by their history of syncope, aflutter, wegeners  Additional history obtained: Additional history obtained from EMS  Records reviewed Care Everywhere/External Records  Lab Tests: I Ordered, and personally interpreted labs.  The pertinent results include:   CBC with mild leukocytosis CMP with mildly elevated cr, otherwise reassuring  EKG with NSR, nonspecific IVCD, inferior infarct, old, lateral leads are also involved  Imaging Studies ordered: I ordered, independently visualized, and interpreted imaging which showed  CT head - 1. Anterior scalp hematoma without underlying skull fracture identified. 2. Normal for age non contrast CT appearance of the brain.  CT maxillofacial -  1. Left  supraorbital and right forehead superficial soft tissue injury. No underlying facial fracture. 2. Previous paranasal sinus surgery.  CT cervical spine - 1. No acute traumatic injury identified in the cervical spine. 2. 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  I personally viewed and interpreted the cardiac monitor which showed an underlying rhythm of:  sinus rhythm  Medicines ordered and prescription drug management: pt declined medications   Critical Interventions: lac repair, ccollar  Complexity of problems addressed: Patients presentation is most consistent with  acute complicated illness/injury requiring diagnostic workup  Disposition: After consideration of the diagnostic results and the patients response to treatment,  I feel that the patent would benefit from discharge home with follow-up with his cardiologist.  He has had intermittent symptoms similar to this for greater than a year and has already established care with his cardiologist about this.  Do not feel that he requires readmission for additional work-up at this time.  He has had no arrhythmia on monitor today.  He is feeling well.  Symptoms patient repair was completed.  Imaging is reassuring.  Labs are reassuring.  Patient will be discharged in stable condition. .    Final Clinical Impression(s) / ED Diagnoses Final diagnoses:  Syncope, unspecified syncope type  Injury of head, initial encounter  Laceration of head without foreign body, unspecified part of head, initial encounter    Rx / DC Orders ED Discharge Orders     None         Rodney Booze, PA-C 02/01/22 8022    Merryl Hacker, MD 02/05/22 [phone]

## 2022-02-01 NOTE — Discharge Instructions (Addendum)
You were given a referral to an ear nose and throat doctor to follow up with if you experience problems with left eyebrow laceration.   The sutures that were place are dissolvable and will not need to be removed   You will need to follow up with your cardiologist in regards to today's visit today.   Please return to the emergency department for any new or worsening symptoms.

## 2022-02-01 NOTE — ED Triage Notes (Signed)
Pt in after syncopal fall while getting up to use bathroom tonight. States he got up, felt nauseous, and hit L head on wall. 1.5in lac to L eyelid, bleeding controlled. No thinners. Hx of same a few months ago, and he is currently wearing a loop recorder. Denies any cp or sob presently, but EMS states he was very sob on floor upon their arrival. C-collar in place, GCS 15

## 2022-02-01 NOTE — ED Notes (Signed)
Patient verbalizes understanding of discharge instructions. Opportunity for questioning and answers were provided. Armband removed by staff, pt discharged from ED. Wheeled out to lobby  

## 2022-10-13 IMAGING — CT CT CERVICAL SPINE W/O CM
3 of 4 series · 10 of 33 positions shown, 12 images · non-contrast
Comparison: Head and face CT today.

CLINICAL DATA: 75-year-old male status post syncope and fall.
Struck left side of head.



[Series 3: sagittal bone · sagittal · 0.25mm/px · 5 of 61 slices shown, 6 images]
[im 21/61  bone]
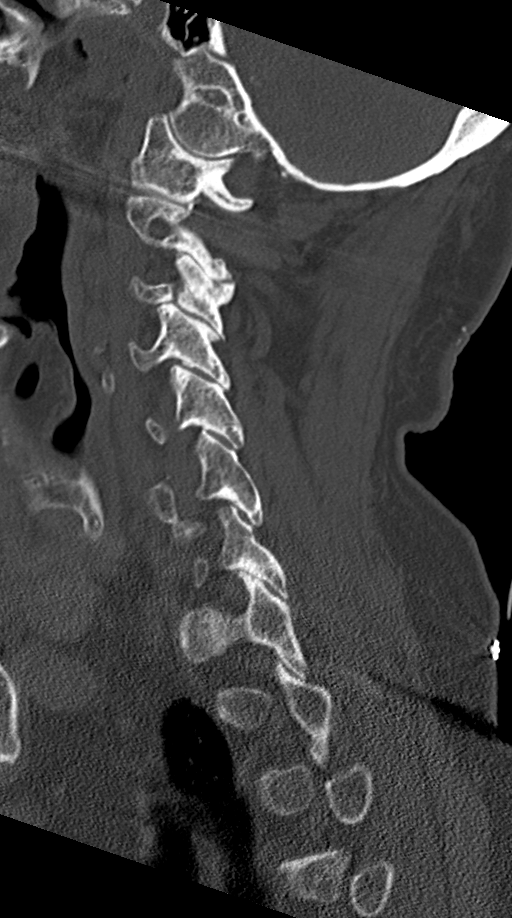
[im 26/61  bone]
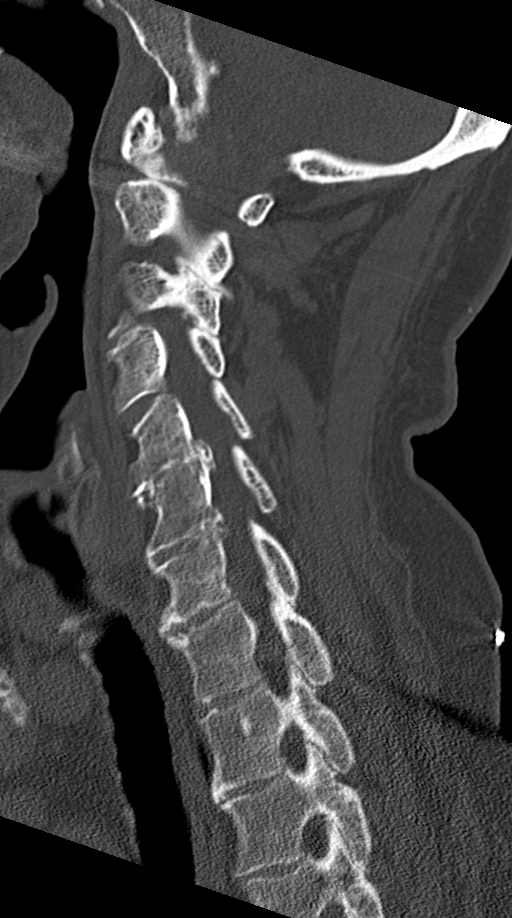
[im 31/61  soft-tissue]
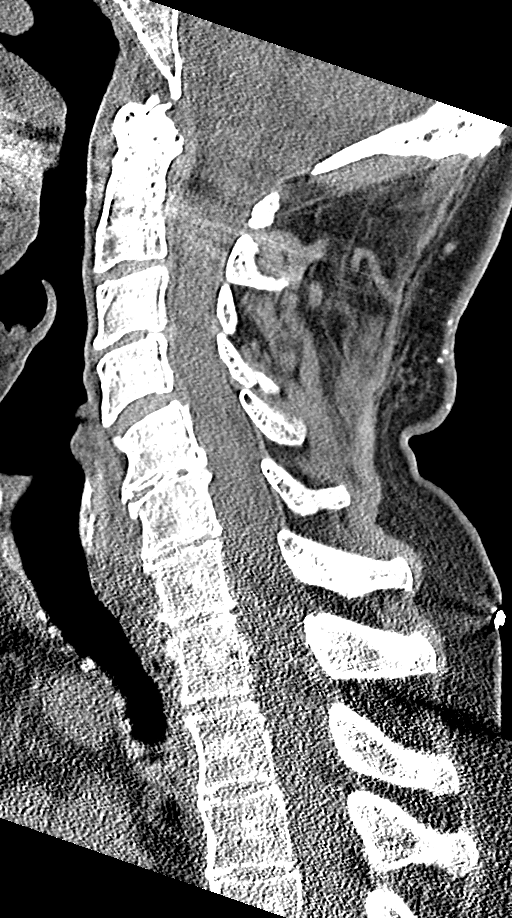
[im 31/61  bone]
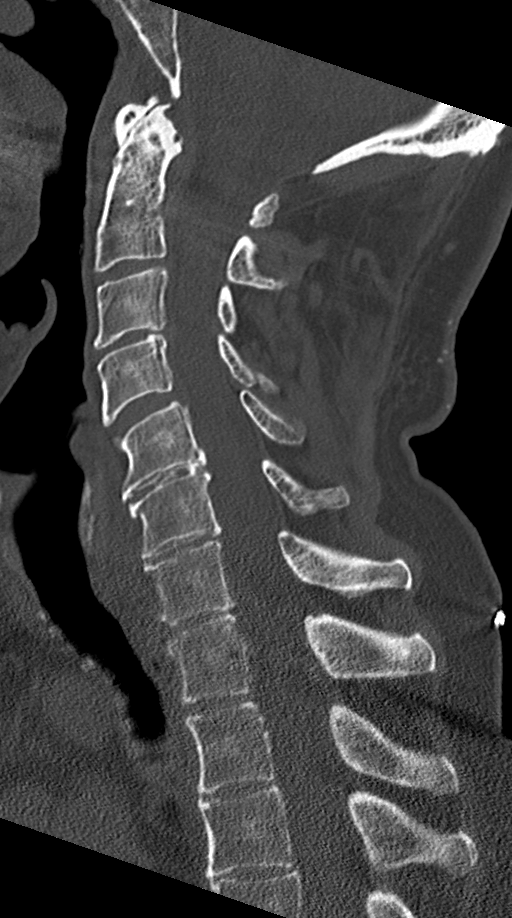
[im 36/61  bone]
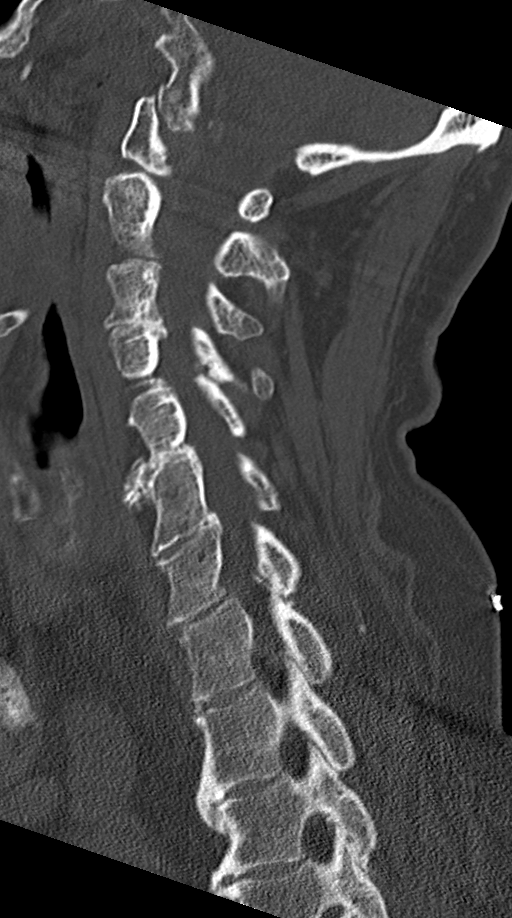
[im 41/61  bone]
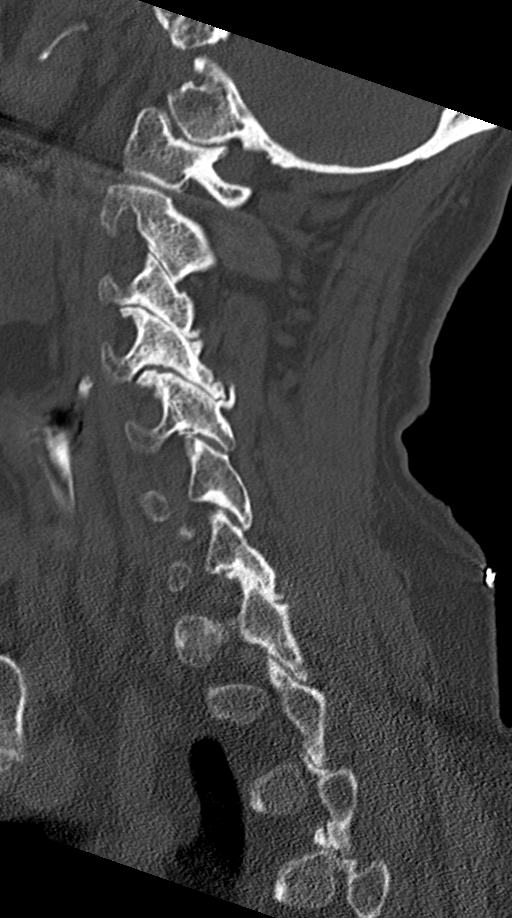

[Series 6: orthogonal axials · axial · 0.23mm/px · z∈[-101,-38]mm · 2 of 107 slices shown, 3 images]
[im 36/107  soft-tissue]
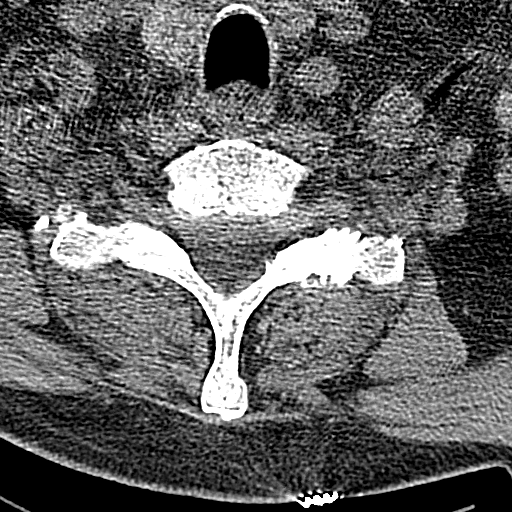
[im 36/107  bone]
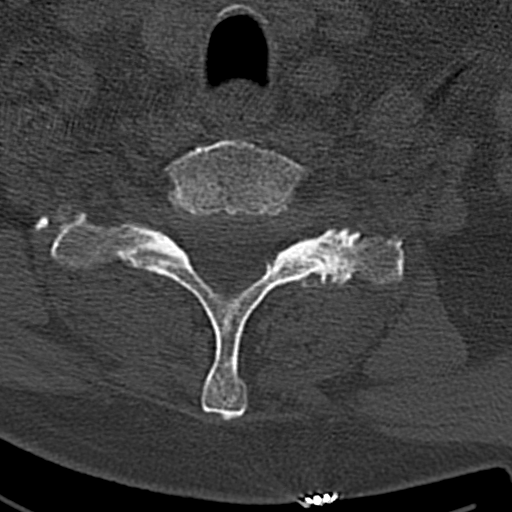
[im 71/107  bone]
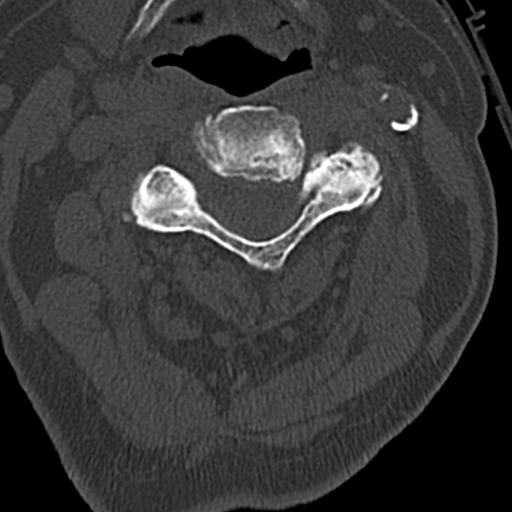

[Series 7: coronal bone · coronal · 0.24mm/px · 3 of 58 slices shown]
[im 12/58  bone]
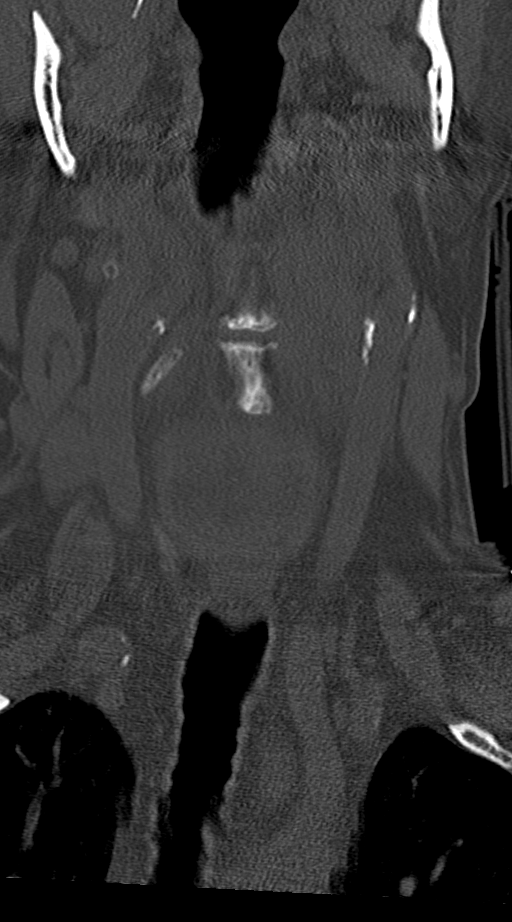
[im 23/58  bone]
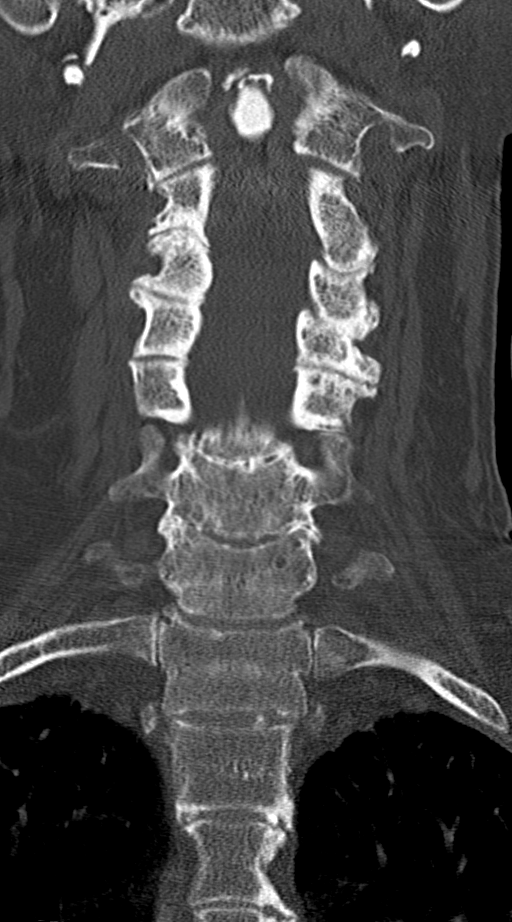
[im 35/58  bone]
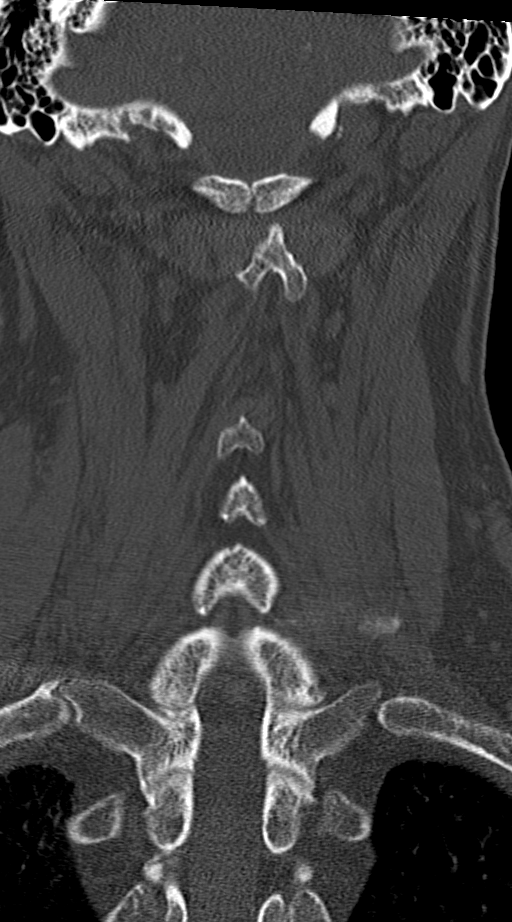

[10 of 33 positions shown; findings below may reference images not displayed]

FINDINGS: Alignment: Mild straightening of cervical lordosis. Cervicothoracic
junction alignment is within normal limits. Bilateral posterior
element alignment is within normal limits.

Skull base and vertebrae: Visualized skull base is intact. No
atlanto-occipital dissociation. Congenital partial incomplete
ossification of the posterior C1 ring, normal variant. C1 and C2
appear intact and aligned. No acute osseous abnormality identified.

Soft tissues and spinal canal: No prevertebral fluid or swelling. No
visible canal hematoma. Calcified left carotid atherosclerosis.
Subcentimeter right thyroid hypodense nodule Not clinically
significant; no follow-up imaging recommended (ref: [HOSPITAL]. [DATE]): 143-50).

Disc levels: Advanced upper cervical spine facet degeneration,
moderate to severe on the right at C2-C3 and on the left at C4-C5.
Developing interbody ankylosis at C5-C6 and C6-C7. No convincing
cervical spinal stenosis.

Upper chest: Visible upper thoracic levels appear intact. Negative
lung apices, noncontrast visible superior mediastinum.
IMPRESSION: 1. No acute traumatic injury identified in the cervical spine.
2. Advanced degeneration of cervical facets above developing
interbody ankylosis at both C5-C6 and C6-C7.

## 2024-12-12 ENCOUNTER — Emergency Department (HOSPITAL_BASED_OUTPATIENT_CLINIC_OR_DEPARTMENT_OTHER)
Admission: EM | Admit: 2024-12-12 | Discharge: 2024-12-12 | Disposition: A | Discharge status: Home / Self Care | Source: Ambulatory Visit | Attending: Emergency Medicine | Admitting: Emergency Medicine

## 2024-12-12 ENCOUNTER — Encounter (HOSPITAL_BASED_OUTPATIENT_CLINIC_OR_DEPARTMENT_OTHER): Payer: Self-pay

## 2024-12-12 ENCOUNTER — Ambulatory Visit: Payer: Self-pay

## 2024-12-12 ENCOUNTER — Other Ambulatory Visit: Payer: Self-pay

## 2024-12-12 ENCOUNTER — Emergency Department (HOSPITAL_BASED_OUTPATIENT_CLINIC_OR_DEPARTMENT_OTHER)

## 2024-12-12 DIAGNOSIS — Z85038 Personal history of other malignant neoplasm of large intestine: Secondary | ICD-10-CM | POA: Diagnosis not present

## 2024-12-12 DIAGNOSIS — R519 Headache, unspecified: Secondary | ICD-10-CM

## 2024-12-12 DIAGNOSIS — I10 Essential (primary) hypertension: Secondary | ICD-10-CM | POA: Diagnosis not present

## 2024-12-12 LAB — COMPREHENSIVE METABOLIC PANEL WITH GFR
ALT: 11 U/L (ref 0–44)
AST: 18 U/L (ref 15–41)
Albumin: 3.8 g/dL (ref 3.5–5.0)
Alkaline Phosphatase: 56 U/L (ref 38–126)
Anion gap: 13 (ref 5–15)
BUN: 27 mg/dL — ABNORMAL HIGH (ref 8–23)
CO2: 24 mmol/L (ref 22–32)
Calcium: 8.7 mg/dL — ABNORMAL LOW (ref 8.9–10.3)
Chloride: 103 mmol/L (ref 98–111)
Creatinine, Ser: 1.35 mg/dL — ABNORMAL HIGH (ref 0.61–1.24)
GFR, Estimated: 53 mL/min — ABNORMAL LOW
Glucose, Bld: 83 mg/dL (ref 70–99)
Potassium: 4.7 mmol/L (ref 3.5–5.1)
Sodium: 140 mmol/L (ref 135–145)
Total Bilirubin: 0.3 mg/dL (ref 0.0–1.2)
Total Protein: 5.8 g/dL — ABNORMAL LOW (ref 6.5–8.1)

## 2024-12-12 LAB — CBC WITH DIFFERENTIAL/PLATELET
Abs Immature Granulocytes: 0.1 K/uL — ABNORMAL HIGH (ref 0.00–0.07)
Basophils Absolute: 0.1 K/uL (ref 0.0–0.1)
Basophils Relative: 1 %
Eosinophils Absolute: 0.1 K/uL (ref 0.0–0.5)
Eosinophils Relative: 1 %
HCT: 41.2 % (ref 39.0–52.0)
Hemoglobin: 13.4 g/dL (ref 13.0–17.0)
Immature Granulocytes: 1 %
Lymphocytes Relative: 14 %
Lymphs Abs: 1.2 K/uL (ref 0.7–4.0)
MCH: 29.6 pg (ref 26.0–34.0)
MCHC: 32.5 g/dL (ref 30.0–36.0)
MCV: 90.9 fL (ref 80.0–100.0)
Monocytes Absolute: 0.6 K/uL (ref 0.1–1.0)
Monocytes Relative: 7 %
Neutro Abs: 6.5 K/uL (ref 1.7–7.7)
Neutrophils Relative %: 76 %
Platelets: 304 K/uL (ref 150–400)
RBC: 4.53 MIL/uL (ref 4.22–5.81)
RDW: 13.6 % (ref 11.5–15.5)
WBC: 8.5 K/uL (ref 4.0–10.5)
nRBC: 0 % (ref 0.0–0.2)

## 2024-12-12 MED ORDER — AMLODIPINE BESYLATE 2.5 MG PO TABS: 2.5000 mg | ORAL_TABLET | Freq: Every day | ORAL | 0 refills | Status: AC

## 2024-12-12 NOTE — ED Triage Notes (Signed)
 Patient reports that he has had a headache for several days now and he checked his blood pressure and his advice nurse recommended to come to the emergency room. Patient denies taking blood pressure medication and only takes prednisone .

## 2024-12-12 NOTE — ED Provider Notes (Signed)
 " [place] [facility] Provider Note   CSN: [id number] Arrival date & time: 12/12/24  1756     Patient presents with: Hypertension   Timothy Chase is a 78 y.o. male.   HPI      78 year old male with history of adrenal insufficiency, atrial flutter post ablation not on anticoagulation, colon cancer, presents with concern for headache and hypertension.  Headache, history of wegeners, over the last 3 days having symptoms similar to wegeners flare, friend came with a cuff and said your blood pressure is over 200 and recommended being seen, called PCP in [place] , said go to urgent care.  High blood pressure is so high you need to go to ED 3 days headache, ears feel plugged up, eyes hurt No fever, good appetite Denies numbness, weakness, difficulty talking or walking, visual changes or facial droop.   Headache exactly the same as wegeners in the past, usually improves with time, does not do steroids for it, had side effects of increased steroids in the past    H xo fablation not on anticoagulation Adrenal insufficiency  Not on blood pressure medication   Past Medical History:  Diagnosis Date   Adrenal insufficiency (Addison's disease) (HCC)    Atrial flutter (HCC)    a. ablation 2014.   Cancer [facility])    Colon   Wegener's granulomatosis with renal involvement (HCC)      Prior to Admission medications  Medication Sig Start Date End Date Taking? Authorizing Provider  amLODipine  (NORVASC ) 2.5 MG tablet Take 1 tablet (2.5 mg total) by mouth daily. 12/12/24  Yes Dreama Longs, MD  alendronate (FOSAMAX) 70 MG tablet Take 70 mg by mouth once a week. 04/18/21   [provider]  cholecalciferol (VITAMIN D3) 25 MCG (1000 UNIT) tablet Take 1,000 Units by mouth daily.    [provider]  Multiple Vitamins-Minerals (ZINC PO) Take 1 tablet by mouth daily.    [provider]  predniSONE  (DELTASONE ) 5 MG tablet Take 1 tablet  (5 mg total) by mouth in the morning. 05/30/21   Patsy Lenis, MD    Allergies: Patient has no known allergies.    Review of Systems  Updated Vital Signs BP (!) 168/72   Pulse (!) 54   Temp (!) 97.5 F (36.4 C) (Oral)   Resp 20   Ht 5' 7 (1.702 m)   Wt 71.2 kg   SpO2 100%   BMI 24.59 kg/m   Physical Exam Constitutional:      General: He is not in acute distress.    Appearance: Normal appearance. He is not ill-appearing.  HENT:     Head: Normocephalic and atraumatic.  Eyes:     General: No visual field deficit.    Extraocular Movements: Extraocular movements intact.     Conjunctiva/sclera: Conjunctivae normal.     Pupils: Pupils are equal, round, and reactive to light.  Cardiovascular:     Rate and Rhythm: Normal rate and regular rhythm.     Pulses: Normal pulses.  Pulmonary:     Effort: Pulmonary effort is normal. No respiratory distress.  Musculoskeletal:        General: No swelling or tenderness.     Cervical back: Normal range of motion.  Skin:    General: Skin is warm and dry.     Findings: No erythema or rash.  Neurological:     General: No focal deficit present.     Mental Status: He is alert and oriented  to person, place, and time.     GCS: GCS eye subscore is 4. GCS verbal subscore is 5. GCS motor subscore is 6.     Cranial Nerves: No cranial nerve deficit, dysarthria or facial asymmetry.     Sensory: No sensory deficit.     Motor: No weakness or tremor.     Coordination: Coordination normal. Finger-Nose-Finger Test normal.     Gait: Gait normal.     (all labs ordered are listed, but only abnormal results are displayed) Labs Reviewed  CBC WITH DIFFERENTIAL/PLATELET - Abnormal; Notable for the following components:      Result Value   Abs Immature Granulocytes 0.10 (*)    All other components within normal limits  COMPREHENSIVE METABOLIC PANEL WITH GFR - Abnormal; Notable for the following components:   BUN 27 (*)    Creatinine, Ser 1.35 (*)     Calcium 8.7 (*)    Total Protein 5.8 (*)    GFR, Estimated 53 (*)    All other components within normal limits    EKG: None  Radiology: CT Head Wo Contrast Result Date: 12/12/2024 EXAM: CT HEAD WITHOUT CONTRAST 12/12/2024 10:15:00 PM TECHNIQUE: CT of the head was performed without the administration of intravenous contrast. Automated exposure control, iterative reconstruction, and/or weight based adjustment of the mA/kV was utilized to reduce the radiation dose to as low as reasonably achievable. COMPARISON: CT head 02/01/2022. CLINICAL HISTORY: Headache, new onset (Age >= 51y). FINDINGS: BRAIN AND VENTRICLES: No acute hemorrhage. No evidence of acute infarct. No hydrocephalus. No extra-axial collection. No mass effect or midline shift. Calcified atherosclerotic plaque within cavernous/supraclinoid ICA and intradural vertebral arteries. ORBITS: Bilateral lens replacement. SINUSES: No acute abnormality. SOFT TISSUES AND SKULL: No acute soft tissue abnormality. No skull fracture. IMPRESSION: 1. No acute intracranial abnormality. Electronically signed by: Morgane Naveau MD 12/12/2024 10:26 PM EST RP Workstation: [id number]     Procedures   Medications Ordered in the ED - No data to display                                    78 year old male with history of adrenal insufficiency, atrial flutter post ablation not on anticoagulation, colon cancer, presents with concern for headache and hypertension.  Labs completed and personally evaluated by me show no clnically significant electrolyte abnormality, similar renal function.  CT head completed given headache and hypertension shows no sign of ICH or other abnormalities.  Neurologic exam normal, no chest pain or dyspnea, clinically doubt CVA, ACS, aortic dissection, acute pulmonary edema.   Reports he does not do higher dose steroids with these symptoms due to bad side effects in the past and usually does supportive care for these recurrent  headaches he attributes to Wegeners.. Not having blurred vision.  At this time, do not suspect emergent etiology of headache to require admission or surgery.  Blood pressures down to 146 without intervention, with range primarily 160s-190s. Given severity of hypertension, feel antihypertensives indicated. It is possible BP more exaggerated with pain and anxiety of being in the emergency department and will start very low dose amlodipine  2.5mg  that can be titrated up as an outpatient.  Patient discharged in stable condition with understanding of reasons to return.       Final diagnoses:  Hypertension, unspecified type  Acute nonintractable headache, unspecified headache type    ED Discharge Orders  Ordered    amLODipine  (NORVASC ) 2.5 MG tablet  Daily        12/12/24 2310               Dreama Longs, MD 12/13/24 0945  "

## 2024-12-16 ENCOUNTER — Emergency Department (HOSPITAL_BASED_OUTPATIENT_CLINIC_OR_DEPARTMENT_OTHER)

## 2024-12-16 ENCOUNTER — Other Ambulatory Visit: Payer: Self-pay

## 2024-12-16 ENCOUNTER — Emergency Department (HOSPITAL_BASED_OUTPATIENT_CLINIC_OR_DEPARTMENT_OTHER)
Admission: EM | Admit: 2024-12-16 | Discharge: 2024-12-17 | Disposition: A | Discharge status: Home / Self Care | Attending: Emergency Medicine | Admitting: Emergency Medicine

## 2024-12-16 DIAGNOSIS — M3130 Wegener's granulomatosis without renal involvement: Secondary | ICD-10-CM | POA: Diagnosis not present

## 2024-12-16 DIAGNOSIS — L538 Other specified erythematous conditions: Secondary | ICD-10-CM

## 2024-12-16 DIAGNOSIS — R220 Localized swelling, mass and lump, head: Secondary | ICD-10-CM | POA: Diagnosis present

## 2024-12-16 DIAGNOSIS — Z79899 Other long term (current) drug therapy: Secondary | ICD-10-CM | POA: Diagnosis not present

## 2024-12-16 DIAGNOSIS — Z85038 Personal history of other malignant neoplasm of large intestine: Secondary | ICD-10-CM | POA: Diagnosis not present

## 2024-12-16 LAB — CBC WITH DIFFERENTIAL/PLATELET
Abs Immature Granulocytes: 0.08 K/uL — ABNORMAL HIGH (ref 0.00–0.07)
Basophils Absolute: 0 K/uL (ref 0.0–0.1)
Basophils Relative: 1 %
Eosinophils Absolute: 0.1 K/uL (ref 0.0–0.5)
Eosinophils Relative: 1 %
HCT: 39.7 % (ref 39.0–52.0)
Hemoglobin: 13.1 g/dL (ref 13.0–17.0)
Immature Granulocytes: 1 %
Lymphocytes Relative: 10 %
Lymphs Abs: 0.9 K/uL (ref 0.7–4.0)
MCH: 30.4 pg (ref 26.0–34.0)
MCHC: 33 g/dL (ref 30.0–36.0)
MCV: 92.1 fL (ref 80.0–100.0)
Monocytes Absolute: 0.8 K/uL (ref 0.1–1.0)
Monocytes Relative: 9 %
Neutro Abs: 6.9 K/uL (ref 1.7–7.7)
Neutrophils Relative %: 78 %
Platelets: 308 K/uL (ref 150–400)
RBC: 4.31 MIL/uL (ref 4.22–5.81)
RDW: 13.8 % (ref 11.5–15.5)
WBC: 8.7 K/uL (ref 4.0–10.5)
nRBC: 0 % (ref 0.0–0.2)

## 2024-12-16 LAB — COMPREHENSIVE METABOLIC PANEL WITH GFR
ALT: 27 U/L (ref 0–44)
AST: 35 U/L (ref 15–41)
Albumin: 3.9 g/dL (ref 3.5–5.0)
Alkaline Phosphatase: 69 U/L (ref 38–126)
Anion gap: 9 (ref 5–15)
BUN: 28 mg/dL — ABNORMAL HIGH (ref 8–23)
CO2: 29 mmol/L (ref 22–32)
Calcium: 8.8 mg/dL — ABNORMAL LOW (ref 8.9–10.3)
Chloride: 106 mmol/L (ref 98–111)
Creatinine, Ser: 1.33 mg/dL — ABNORMAL HIGH (ref 0.61–1.24)
GFR, Estimated: 54 mL/min — ABNORMAL LOW
Glucose, Bld: 106 mg/dL — ABNORMAL HIGH (ref 70–99)
Potassium: 4.5 mmol/L (ref 3.5–5.1)
Sodium: 144 mmol/L (ref 135–145)
Total Bilirubin: <0.2 mg/dL (ref 0.0–1.2)
Total Protein: 5.7 g/dL — ABNORMAL LOW (ref 6.5–8.1)

## 2024-12-16 MED ORDER — IOHEXOL 300 MG/ML  SOLN
75.0000 mL | Freq: Once | INTRAMUSCULAR | Status: AC | PRN
  Administered 2024-12-16: 75 mL via INTRAVENOUS

## 2024-12-16 NOTE — ED Notes (Signed)
 Patient transported to CT

## 2024-12-16 NOTE — ED Notes (Signed)
 Pt wears corrective lenses at base line notes no vision change that he can tell.

## 2024-12-16 NOTE — ED Triage Notes (Signed)
 Pt c/o L eye swelling and itching, drainage x 2 days, improved today. Epistaxis last night.   Also c/o ongoing elevated bp. Denies taking bp meds. Denies chest pain, shob.

## 2024-12-16 NOTE — ED Provider Notes (Signed)
 " [place] [facility] Provider Note   CSN: [id number] Arrival date & time: 12/16/24  1944     Patient presents with: Facial Swelling   Timothy Chase is a 78 y.o. male.  {Add pertinent medical, surgical, social history, OB history to HPI:32947} HPI     Today BP 150  Saturday 120-130, Monday a little higher, TOday 150, sending to [facility] doctor  Sunday had eye swelling, weeping a lot, both eyes watery, had some crusting, usuing some witch hazel, itching around the eye. Not painful. couldn't sleep well that night Eye without significant pain, uncomfortable feeling  Vision has not been clear, not crystal cleared but not blurred No other itching or rash Did not start amlodipine  yet One tylenol  Headache went away, comes and goes, every now and then shooting pain to left temple  Sunday bloody nose didn't last long, has not had one in years Denies numbness, weakness, difficulty talking or walking, visual changes or facial droop.    More phlegm, waking up coughing a bit last night    Past Medical History:  Diagnosis Date   Adrenal insufficiency (Addison's disease) (HCC)    Atrial flutter (HCC)    a. ablation 2014.   Cancer [facility])    Colon   Wegener's granulomatosis with renal involvement (HCC)      Prior to Admission medications  Medication Sig Start Date End Date Taking? Authorizing Provider  alendronate (FOSAMAX) 70 MG tablet Take 70 mg by mouth once a week. 04/18/21   [provider]  amLODipine  (NORVASC ) 2.5 MG tablet Take 1 tablet (2.5 mg total) by mouth daily. 12/12/24   Dreama Longs, MD  cholecalciferol (VITAMIN D3) 25 MCG (1000 UNIT) tablet Take 1,000 Units by mouth daily.    [provider]  Multiple Vitamins-Minerals (ZINC PO) Take 1 tablet by mouth daily.    [provider]  predniSONE  (DELTASONE ) 5 MG tablet Take 1 tablet (5 mg total) by mouth in the morning. 05/30/21   Patsy Lenis, MD     Allergies: Patient has no known allergies.    Review of Systems  Updated Vital Signs BP (!) 172/75 (BP Location: Right Arm)   Pulse 72   Temp 98 F (36.7 C)   Resp 18   Ht 5' 7 (1.702 m)   Wt 70.3 kg   SpO2 100%   BMI 24.28 kg/m   Physical Exam  (all labs ordered are listed, but only abnormal results are displayed) Labs Reviewed - No data to display  EKG: None  Radiology: No results found.  {Document cardiac monitor, telemetry assessment procedure when appropriate:32947} Procedures   Medications Ordered in the ED - No data to display    {Click here for ABCD2, HEART and other calculators REFRESH Note before signing:1}                              Medical Decision Making  ***  {Document critical care time when appropriate  Document review of labs and clinical decision tools ie CHADS2VASC2, etc  Document your independent review of radiology images and any outside records  Document your discussion with family members, caretakers and with consultants  Document social determinants of health affecting pt's care  Document your decision making why or why not admission, treatments were needed:32947:::1}   Final diagnoses:  None    ED Discharge Orders     None        "

## 2024-12-16 NOTE — ED Notes (Signed)
 [facility] consult for Rheumatology consult.

## 2024-12-16 NOTE — ED Provider Notes (Incomplete)
 Received patient in turnover from Dr. Dreama.  Please see their note for further details of Hx, PE.  Briefly patient is a 78 y.o. male with a Facial Swelling .  Hx of Wegeners here visiting.  Seen recently with headache. Started having swelling and redness to L eye.   He feels like its his chronic disease.

## 2024-12-17 MED ORDER — AMOXICILLIN-POT CLAVULANATE 875-125 MG PO TABS: 1.0000 | ORAL_TABLET | Freq: Two times a day (BID) | ORAL | 0 refills | Status: AC

## 2024-12-17 MED ORDER — PREDNISONE 10 MG PO TABS: ORAL_TABLET | ORAL | 0 refills | Status: DC

## 2024-12-17 NOTE — ED Notes (Signed)

## 2025-01-08 ENCOUNTER — Encounter (HOSPITAL_BASED_OUTPATIENT_CLINIC_OR_DEPARTMENT_OTHER): Payer: Self-pay

## 2025-01-08 ENCOUNTER — Emergency Department (HOSPITAL_BASED_OUTPATIENT_CLINIC_OR_DEPARTMENT_OTHER)
Admission: EM | Admit: 2025-01-08 | Discharge: 2025-01-08 | Disposition: A | Discharge status: Home / Self Care | Attending: Emergency Medicine | Admitting: Emergency Medicine

## 2025-01-08 ENCOUNTER — Other Ambulatory Visit: Payer: Self-pay

## 2025-01-08 DIAGNOSIS — R220 Localized swelling, mass and lump, head: Secondary | ICD-10-CM | POA: Diagnosis present

## 2025-01-08 DIAGNOSIS — Z79899 Other long term (current) drug therapy: Secondary | ICD-10-CM | POA: Insufficient documentation

## 2025-01-08 LAB — BASIC METABOLIC PANEL WITH GFR
Anion gap: 14 (ref 5–15)
BUN: 29 mg/dL — ABNORMAL HIGH (ref 8–23)
CO2: 24 mmol/L (ref 22–32)
Calcium: 8.6 mg/dL — ABNORMAL LOW (ref 8.9–10.3)
Chloride: 105 mmol/L (ref 98–111)
Creatinine, Ser: 1.61 mg/dL — ABNORMAL HIGH (ref 0.61–1.24)
GFR, Estimated: 43 mL/min — ABNORMAL LOW
Glucose, Bld: 101 mg/dL — ABNORMAL HIGH (ref 70–99)
Potassium: 3.6 mmol/L (ref 3.5–5.1)
Sodium: 143 mmol/L (ref 135–145)

## 2025-01-08 LAB — CBC WITH DIFFERENTIAL/PLATELET
Abs Immature Granulocytes: 0.13 K/uL — ABNORMAL HIGH (ref 0.00–0.07)
Basophils Absolute: 0 K/uL (ref 0.0–0.1)
Basophils Relative: 0 %
Eosinophils Absolute: 0 K/uL (ref 0.0–0.5)
Eosinophils Relative: 0 %
HCT: 42.8 % (ref 39.0–52.0)
Hemoglobin: 13.6 g/dL (ref 13.0–17.0)
Immature Granulocytes: 1 %
Lymphocytes Relative: 12 %
Lymphs Abs: 1.7 K/uL (ref 0.7–4.0)
MCH: 29.4 pg (ref 26.0–34.0)
MCHC: 31.8 g/dL (ref 30.0–36.0)
MCV: 92.6 fL (ref 80.0–100.0)
Monocytes Absolute: 0.9 K/uL (ref 0.1–1.0)
Monocytes Relative: 7 %
Neutro Abs: 10.6 K/uL — ABNORMAL HIGH (ref 1.7–7.7)
Neutrophils Relative %: 80 %
Platelets: 284 K/uL (ref 150–400)
RBC: 4.62 MIL/uL (ref 4.22–5.81)
RDW: 13.9 % (ref 11.5–15.5)
WBC: 13.4 K/uL — ABNORMAL HIGH (ref 4.0–10.5)
nRBC: 0 % (ref 0.0–0.2)

## 2025-01-08 MED ORDER — ERYTHROMYCIN 5 MG/GM OP OINT
TOPICAL_OINTMENT | Freq: Once | OPHTHALMIC | Status: AC
  Filled 2025-01-08: qty 3.5

## 2025-01-08 MED ORDER — AMOXICILLIN-POT CLAVULANATE 875-125 MG PO TABS
1.0000 | ORAL_TABLET | Freq: Once | ORAL | Status: AC
  Administered 2025-01-08: 1 via ORAL
  Filled 2025-01-08: qty 1

## 2025-01-08 MED ORDER — PREDNISONE 10 MG PO TABS: ORAL_TABLET | ORAL | 0 refills | Status: AC

## 2025-01-08 MED ORDER — PREDNISONE 5 MG PO TABS: 5.0000 mg | ORAL_TABLET | Freq: Every morning | ORAL | 1 refills | Status: AC

## 2025-01-08 MED ORDER — PREDNISONE 20 MG PO TABS
30.0000 mg | ORAL_TABLET | Freq: Once | ORAL | Status: AC
  Administered 2025-01-08: 30 mg via ORAL
  Filled 2025-01-08: qty 1

## 2025-01-08 MED ORDER — AMOXICILLIN-POT CLAVULANATE 875-125 MG PO TABS: 1.0000 | ORAL_TABLET | Freq: Two times a day (BID) | ORAL | 0 refills | Status: AC

## 2025-01-08 NOTE — ED Provider Notes (Signed)
 " [place] [facility] Provider Note   CSN: [id number] Arrival date & time: 01/08/25  1012     Patient presents with: Facial Swelling   Ellard Nan is a 79 y.o. male.   Patient here with some swelling around his left eye.  Similar episode last month that got better with prednisone  burst but has reoccurred here in the last few days.  He is increased steroids per his rheumatologist with improvement but now running out of his prednisone .  He feels like the swelling has gotten better since his increase his steroids.  He denies any fever chills vision loss eye pain.  He has a history of Wegener's granulomatous on chronic steroids 5 mg daily.  He does have some adrenal insufficiency.  Denies any vision loss headache weakness numbness tingling.  No leg swelling.  No swelling of the right eye.  No vision loss.  The history is provided by the patient.       Prior to Admission medications  Medication Sig Start Date End Date Taking? Authorizing Provider  amoxicillin -clavulanate (AUGMENTIN ) 875-125 MG tablet Take 1 tablet by mouth every 12 (twelve) hours. 01/08/25  Yes Renley Gutman, DO  alendronate (FOSAMAX) 70 MG tablet Take 70 mg by mouth once a week. 04/18/21   [provider]  amLODipine  (NORVASC ) 2.5 MG tablet Take 1 tablet (2.5 mg total) by mouth daily. 12/12/24   Dreama Longs, MD  cholecalciferol (VITAMIN D3) 25 MCG (1000 UNIT) tablet Take 1,000 Units by mouth daily.    [provider]  Multiple Vitamins-Minerals (ZINC PO) Take 1 tablet by mouth daily.    [provider]  predniSONE  (DELTASONE ) 10 MG tablet Take 30mg  for one week, followed by 20mg  for 5 days, followed by 10mg  for 5 days, followed by your 5mg  daily 01/08/25   Ruthe Cornet, DO  predniSONE  (DELTASONE ) 5 MG tablet Take 1 tablet (5 mg total) by mouth in the morning. 01/08/25   Ruthe Cornet, DO    Allergies: Patient has no known allergies.    Review of  Systems  Updated Vital Signs BP (!) 159/80   Pulse 68   Temp 97.7 F (36.5 C) (Oral)   Resp 16   Ht 5' 8 (1.727 m)   Wt 70.3 kg   SpO2 99%   BMI 23.57 kg/m   Physical Exam Vitals and nursing note reviewed.  Constitutional:      General: He is not in acute distress.    Appearance: He is well-developed. He is not ill-appearing.  HENT:     Head: Normocephalic and atraumatic.     Nose: Nose normal.     Mouth/Throat:     Mouth: Mucous membranes are moist.  Eyes:     Extraocular Movements: Extraocular movements intact.     Conjunctiva/sclera: Conjunctivae normal.     Pupils: Pupils are equal, round, and reactive to light.     Comments: Skin trace redness around the left periorbital space but extraocular movements are intact visual fields are intact, no vision loss no pain with extraocular movements pupils are equal reactive  Cardiovascular:     Rate and Rhythm: Normal rate and regular rhythm.     Heart sounds: No murmur heard. Pulmonary:     Effort: Pulmonary effort is normal. No respiratory distress.     Breath sounds: Normal breath sounds.  Abdominal:     Palpations: Abdomen is soft.     Tenderness: There is no abdominal tenderness.  Musculoskeletal:  General: No swelling.     Cervical back: Normal range of motion and neck supple.     Right lower leg: No edema.     Left lower leg: No edema.  Skin:    General: Skin is warm and dry.     Capillary Refill: Capillary refill takes less than 2 seconds.  Neurological:     General: No focal deficit present.     Mental Status: He is alert.  Psychiatric:        Mood and Affect: Mood normal.     (all labs ordered are listed, but only abnormal results are displayed) Labs Reviewed  CBC WITH DIFFERENTIAL/PLATELET - Abnormal; Notable for the following components:      Result Value   WBC 13.4 (*)    Neutro Abs 10.6 (*)    Abs Immature Granulocytes 0.13 (*)    All other components within normal limits  BASIC METABOLIC  PANEL WITH GFR - Abnormal; Notable for the following components:   Glucose, Bld 101 (*)    BUN 29 (*)    Creatinine, Ser 1.61 (*)    Calcium 8.6 (*)    GFR, Estimated 43 (*)    All other components within normal limits    EKG: None  Radiology: No results found.   Procedures   Medications Ordered in the ED  predniSONE  (DELTASONE ) tablet 30 mg (has no administration in time range)  amoxicillin -clavulanate (AUGMENTIN ) 875-125 MG per tablet 1 tablet (has no administration in time range)  erythromycin  ophthalmic ointment (has no administration in time range)                                    Medical Decision Making Amount and/or Complexity of Data Reviewed Labs: ordered.  Risk Prescription drug management.   Addie Carolin is here with a little bit of redness and swelling around his left eye.  History of Wegener's granulomatosis and adrenal insufficiency on chronic steroids 5 mg daily.  He is increasing steroids here the last few days per his rheumatologist back in [place]  [place] but he now he is running out of his steroids.  He had similar episode about a month ago that improved with steroid burst and antibiotics.  His got very minimal redness and swelling in the periorbital space around the left eye.  But there is no pain with his eyes.  Visual fields are normal.  No headache no vision loss no eye pain.  His conjunctiva looks clear.  Extraocular movements are normal without any discomfort.  This got better with steroid burst and antibiotics a month ago.  His rheumatologist from [place]  recommend that he come here.  He is visiting here but supposed to go back in the next few weeks.  He was supposed to follow-up with a rheumatologist here locally who is also been helping out.  At this time I do not think he really needs any major workup.  It is very mild inflammation or redness could be secondary to his Wegener's.  Will put him back on a steroid burst and antibiotics and have him  follow-up with rheumatology.  Will refer him to ophthalmology to see if they can follow-up with him little bit sooner as well to kind of monitor for infectious processes.  But I exam is unremarkable.  He is very well-appearing.  Lab work is at baseline.  Will put him on prednisone  burst Augmentin  and erythromycin  ointment.  Discharged in good condition.  Understands return precautions.  This chart was dictated using voice recognition software.  Despite best efforts to proofread,  errors can occur which can change the documentation meaning.      Final diagnoses:  Left facial swelling    ED Discharge Orders          Ordered    predniSONE  (DELTASONE ) 10 MG tablet        01/08/25 1044    predniSONE  (DELTASONE ) 5 MG tablet  Every morning        01/08/25 1044    amoxicillin -clavulanate (AUGMENTIN ) 875-125 MG tablet  Every 12 hours        01/08/25 1044               [place], DO 01/08/25 1126  "

## 2025-01-08 NOTE — Discharge Instructions (Addendum)
 Start your steroid burst and then go back down to your normal daily dose.  I sent you in a prescription for each.  Take Augmentin  as prescribed.  Take your next dose at dinnertime tonight.  Use erythromycin  eye ointment 4 times a day for the next several days.  If you are able to follow-up with [facility] rheumatologist I also recommend that as well.  Return if symptoms worsen.  Consider follow-up with an eye doctor as well.

## 2025-01-08 NOTE — ED Triage Notes (Signed)
 Pt states he was sent to be seen by his rheumatologist for a flare. Symptoms had resolved after he was seen in December and placed on Prednisone , which helped at that time.
